# Patient Record
Sex: Female | Born: 1945
Health system: Southern US, Community
[De-identification: ages and names within clinical notes are randomized; demographics above are authoritative.]

## PROBLEM LIST (undated history)

## (undated) DIAGNOSIS — I10 Essential (primary) hypertension: Secondary | ICD-10-CM

## (undated) HISTORY — DX: Essential (primary) hypertension: I10

## (undated) HISTORY — PX: TUBAL LIGATION: SHX77

---

## 1999-09-16 ENCOUNTER — Encounter: Payer: Self-pay | Admitting: Obstetrics and Gynecology

## 1999-09-16 ENCOUNTER — Encounter: Admission: RE | Admit: 1999-09-16 | Discharge: 1999-09-16 | Payer: Self-pay | Admitting: Obstetrics and Gynecology

## 1999-10-07 ENCOUNTER — Other Ambulatory Visit: Admission: RE | Admit: 1999-10-07 | Discharge: 1999-10-07 | Payer: Self-pay | Admitting: Obstetrics and Gynecology

## 2000-11-30 ENCOUNTER — Encounter: Payer: Self-pay | Admitting: Obstetrics and Gynecology

## 2000-11-30 ENCOUNTER — Encounter: Admission: RE | Admit: 2000-11-30 | Discharge: 2000-11-30 | Payer: Self-pay | Admitting: Obstetrics and Gynecology

## 2001-01-19 ENCOUNTER — Other Ambulatory Visit: Admission: RE | Admit: 2001-01-19 | Discharge: 2001-01-19 | Payer: Self-pay | Admitting: Obstetrics and Gynecology

## 2001-12-13 ENCOUNTER — Encounter: Admission: RE | Admit: 2001-12-13 | Discharge: 2001-12-13 | Payer: Self-pay | Admitting: Obstetrics and Gynecology

## 2001-12-13 ENCOUNTER — Encounter: Payer: Self-pay | Admitting: Obstetrics and Gynecology

## 2003-09-19 ENCOUNTER — Encounter: Admission: RE | Admit: 2003-09-19 | Discharge: 2003-09-19 | Payer: Self-pay | Admitting: Obstetrics and Gynecology

## 2005-10-09 ENCOUNTER — Encounter: Admission: RE | Admit: 2005-10-09 | Discharge: 2005-10-09 | Payer: Self-pay | Admitting: Obstetrics and Gynecology

## 2006-03-26 ENCOUNTER — Encounter: Admission: RE | Admit: 2006-03-26 | Discharge: 2006-03-26 | Payer: Self-pay | Admitting: Obstetrics and Gynecology

## 2008-03-27 ENCOUNTER — Encounter: Admission: RE | Admit: 2008-03-27 | Discharge: 2008-03-27 | Payer: Self-pay | Admitting: Obstetrics and Gynecology

## 2009-08-01 ENCOUNTER — Encounter: Admission: RE | Admit: 2009-08-01 | Discharge: 2009-08-01 | Payer: Self-pay | Admitting: Obstetrics and Gynecology

## 2011-06-15 ENCOUNTER — Other Ambulatory Visit: Payer: Self-pay | Admitting: Gastroenterology

## 2011-06-15 DIAGNOSIS — R1011 Right upper quadrant pain: Secondary | ICD-10-CM

## 2011-06-22 ENCOUNTER — Ambulatory Visit
Admission: RE | Admit: 2011-06-22 | Discharge: 2011-06-22 | Disposition: A | Discharge status: Home / Self Care | Payer: Medicare Other | Source: Ambulatory Visit | Attending: Gastroenterology | Admitting: Gastroenterology

## 2011-06-22 DIAGNOSIS — R1011 Right upper quadrant pain: Secondary | ICD-10-CM

## 2011-06-25 ENCOUNTER — Other Ambulatory Visit (HOSPITAL_COMMUNITY): Payer: Self-pay | Admitting: Gastroenterology

## 2011-07-03 ENCOUNTER — Other Ambulatory Visit (HOSPITAL_COMMUNITY): Payer: Medicare Other

## 2011-07-14 HISTORY — PX: CHOLECYSTECTOMY: SHX55

## 2011-07-23 ENCOUNTER — Encounter (HOSPITAL_COMMUNITY)
Admission: RE | Admit: 2011-07-23 | Discharge: 2011-07-23 | Disposition: A | Discharge status: Home / Self Care | Payer: Medicare Other | Source: Ambulatory Visit | Attending: Gastroenterology | Admitting: Gastroenterology

## 2011-07-23 DIAGNOSIS — R197 Diarrhea, unspecified: Secondary | ICD-10-CM | POA: Diagnosis not present

## 2011-07-23 DIAGNOSIS — R109 Unspecified abdominal pain: Secondary | ICD-10-CM | POA: Diagnosis not present

## 2011-07-23 MED ORDER — TECHNETIUM TC 99M MEBROFENIN IV KIT
5.3000 | PACK | Freq: Once | INTRAVENOUS | Status: AC | PRN
  Administered 2011-07-23: 5 via INTRAVENOUS

## 2011-07-30 DIAGNOSIS — R1011 Right upper quadrant pain: Secondary | ICD-10-CM | POA: Diagnosis not present

## 2011-08-11 ENCOUNTER — Other Ambulatory Visit: Payer: Self-pay | Admitting: Obstetrics and Gynecology

## 2011-08-11 DIAGNOSIS — Z1231 Encounter for screening mammogram for malignant neoplasm of breast: Secondary | ICD-10-CM

## 2011-09-02 ENCOUNTER — Encounter (INDEPENDENT_AMBULATORY_CARE_PROVIDER_SITE_OTHER): Payer: Self-pay | Admitting: General Surgery

## 2011-09-02 ENCOUNTER — Ambulatory Visit (INDEPENDENT_AMBULATORY_CARE_PROVIDER_SITE_OTHER): Payer: Medicare Other | Admitting: General Surgery

## 2011-09-02 VITALS — BP 152/98 | HR 98 | Temp 98.5°F | Resp 16 | Ht 63.0 in | Wt 134.0 lb

## 2011-09-02 DIAGNOSIS — R1011 Right upper quadrant pain: Secondary | ICD-10-CM

## 2011-09-02 NOTE — Progress Notes (Signed)
Subjective:   Right upper cautery and discomfort and diarrhea  Patient ID: Brianna Snyder, female   DOB: [birth year], 66 y.o.   MRN: [id number]  HPI Patient is a very pleasant 66 year old female referred through the courtesy of Dr. Vilinda Boehringer. I had operated on her husband a number of years ago. She has been followed by Dr. Randa Evens for some time due to abdominal and GI complaints. She states she initially had onset of her symptoms about 6 years ago but they were mild and intermittent. It has now become essentially a daily problem for her. Her main complaint is actually diarrhea. This occurs after eating. She also has right upper quadrant and right mid abdominal discomfort after eating. She says this has not really severe pain but more discomfort and a feeling of something not right". This always occurs after eating and usually with fatty or suite meals and can be avoided with bland low-fat food. She takes Imodium and Pepto-Bismol frequently. Her symptoms will last for about an hour after eating and then gradually fade. She has not had any fever. No radiation of the pain. She has some mild nausea but no vomiting in association.  Workup to date by Dr. Randa Evens includes an abdominal ultrasound with a normal gallbladder and some fatty infiltration of the liver. She has had 2 previous colonoscopies with benign polyps removed but otherwise negative exams. She has had a negative celiac test. HIDA scan was then performed which showed a markedly low ejection fraction of 7% with slight exacerbation of her symptoms with CCK. Past Medical History  Diagnosis Date  . Hypertension    Past Surgical History  Procedure Date  . Tubal ligation    Current Outpatient Prescriptions  Medication Sig Dispense Refill  . triamterene-hydrochlorothiazide (MAXZIDE-25) 37.5-25 MG per tablet Take 1 tablet by mouth daily.       No Known Allergies History  Substance Use Topics  . Smoking status: Never Smoker   . Smokeless  tobacco: Not on file  . Alcohol Use: No    Review of Systems  Constitutional: Negative.   Respiratory: Negative.   Cardiovascular: Negative.   Gastrointestinal: Positive for nausea, abdominal pain and diarrhea. Negative for vomiting, constipation, blood in stool, abdominal distention, anal bleeding and rectal pain.  Musculoskeletal: Negative.   Hematological: Negative.        Objective:   Physical Exam BP 152/98  Pulse 98  Temp(Src) 98.5 F (36.9 C) (Temporal)  Resp 16  Ht 5\' 3"  (1.6 m)  Wt 134 lb (60.782 kg)  BMI 23.74 kg/m2  General: Alert, well-developed Caucasian female, in no distress Skin: Warm and dry without rash or infection. HEENT: No palpable masses or thyromegaly. Sclera nonicteric. Pupils equal round and reactive. Oropharynx clear. Lymph nodes: No cervical, supraclavicular, or inguinal nodes palpable. Lungs: Breath sounds clear and equal without increased work of breathing Cardiovascular: Regular rate and rhythm without murmur. No JVD or edema. Peripheral pulses intact. Abdomen: Nondistended. Soft with mild tenderness in the right mid abdomen and right upper quadrant.. No masses palpable. No organomegaly. No palpable hernias. Extremities: No edema or joint swelling or deformity. No chronic venous stasis changes. Neurologic: Alert and fully oriented. Gait normal.     Assessment:     Persistent and worsening abdominal symptoms of right mid abdominal discomfort and diarrhea post-fatty foods. She has had a thorough workup that is negative except for an abnormal HIDA scan with markedly reduced ejection fraction. I discussed with her that her symptoms are not  classic for gallbladder disease in that diarrhea is her prominent complaint. However she does have some localized discomfort and tenderness. There is a family history of gallbladder disease. I told her that I thought there was about a 50/50 chance of significantly improving her symptoms with cholecystectomy. We  discussed risks of bleeding, infection, bile leak, bile duct injury, possible need for open surgery and anesthetic risks. We discussed that some patients have worsening of diarrhea after gallbladder surgery. After extensive discussion and her careful consideration she feels that her symptoms are worsening and ongoing for long enough that she would like to go ahead with surgery in an attempt to get some relief. I think this is reasonable as she understands the issues very well.    Plan:      Laparoscopic cholecystectomy with intraoperative cholangiogram under general anesthesia as an outpatient

## 2011-09-09 ENCOUNTER — Ambulatory Visit
Admission: RE | Admit: 2011-09-09 | Discharge: 2011-09-09 | Disposition: A | Discharge status: Home / Self Care | Payer: Medicare Other | Source: Ambulatory Visit | Attending: Obstetrics and Gynecology | Admitting: Obstetrics and Gynecology

## 2011-09-09 DIAGNOSIS — Z1231 Encounter for screening mammogram for malignant neoplasm of breast: Secondary | ICD-10-CM

## 2011-09-21 ENCOUNTER — Telehealth (INDEPENDENT_AMBULATORY_CARE_PROVIDER_SITE_OTHER): Payer: Self-pay | Admitting: General Surgery

## 2011-09-21 DIAGNOSIS — R1011 Right upper quadrant pain: Secondary | ICD-10-CM | POA: Diagnosis not present

## 2011-09-23 DIAGNOSIS — K811 Chronic cholecystitis: Secondary | ICD-10-CM | POA: Diagnosis not present

## 2011-09-23 DIAGNOSIS — K824 Cholesterolosis of gallbladder: Secondary | ICD-10-CM

## 2011-10-15 ENCOUNTER — Encounter (INDEPENDENT_AMBULATORY_CARE_PROVIDER_SITE_OTHER): Payer: Self-pay | Admitting: General Surgery

## 2011-10-15 ENCOUNTER — Ambulatory Visit (INDEPENDENT_AMBULATORY_CARE_PROVIDER_SITE_OTHER): Payer: Medicare Other | Admitting: General Surgery

## 2011-10-15 ENCOUNTER — Telehealth (INDEPENDENT_AMBULATORY_CARE_PROVIDER_SITE_OTHER): Payer: Self-pay

## 2011-10-15 VITALS — BP 126/78 | HR 70 | Temp 97.4°F | Resp 16 | Ht 63.0 in | Wt 130.0 lb

## 2011-10-15 DIAGNOSIS — Z09 Encounter for follow-up examination after completed treatment for conditions other than malignant neoplasm: Secondary | ICD-10-CM

## 2011-10-15 NOTE — Progress Notes (Addendum)
History: Patient returns 3 weeks following laparoscopic cholecystectomy for biliary dyskinesia/chronic cholecystitis. Happily, she states she is "thrilled" with the results of the surgery. Her preoperative discomfort and bloating and diarrhea to this point have been completely relieved. She has been able to eat foods that she would not try previously.  Exam: She appears well. Abdomen is soft and nontender. Wounds are well healed.  Assessment and plan: Doing well with excellent relief of symptoms to this point and no evidence of complication. The pathology report is not available on the computer we will get this and forward the information to her. She will return as needed.  Her path report is now available and indicated chronic cholecystitis and cholesterolosis

## 2011-10-15 NOTE — Telephone Encounter (Signed)
Per patient request pathology results faxed to personal fax # [phone].  Results were not available at patient office visit on 10/15/11 w/Dr. Johna Sheriff.

## 2011-10-16 ENCOUNTER — Encounter (INDEPENDENT_AMBULATORY_CARE_PROVIDER_SITE_OTHER): Payer: Self-pay | Admitting: General Surgery

## 2011-10-27 DIAGNOSIS — H02839 Dermatochalasis of unspecified eye, unspecified eyelid: Secondary | ICD-10-CM | POA: Diagnosis not present

## 2011-11-23 DIAGNOSIS — M6530 Trigger finger, unspecified finger: Secondary | ICD-10-CM | POA: Diagnosis not present

## 2011-11-23 DIAGNOSIS — M25549 Pain in joints of unspecified hand: Secondary | ICD-10-CM | POA: Diagnosis not present

## 2011-12-01 DIAGNOSIS — W57XXXA Bitten or stung by nonvenomous insect and other nonvenomous arthropods, initial encounter: Secondary | ICD-10-CM | POA: Diagnosis not present

## 2011-12-01 DIAGNOSIS — T148 Other injury of unspecified body region: Secondary | ICD-10-CM | POA: Diagnosis not present

## 2011-12-11 DIAGNOSIS — H02839 Dermatochalasis of unspecified eye, unspecified eyelid: Secondary | ICD-10-CM | POA: Diagnosis not present

## 2011-12-11 DIAGNOSIS — H5340 Unspecified visual field defects: Secondary | ICD-10-CM | POA: Diagnosis not present

## 2011-12-11 DIAGNOSIS — H0230 Blepharochalasis unspecified eye, unspecified eyelid: Secondary | ICD-10-CM | POA: Diagnosis not present

## 2012-02-14 ENCOUNTER — Emergency Department (HOSPITAL_COMMUNITY): Payer: Medicare Other

## 2012-02-14 ENCOUNTER — Inpatient Hospital Stay (HOSPITAL_COMMUNITY)
Admission: EM | Admit: 2012-02-14 | Discharge: 2012-02-17 | DRG: 064 | Disposition: A | Discharge status: Home / Self Care | Payer: Medicare Other | Attending: Internal Medicine | Admitting: Internal Medicine

## 2012-02-14 ENCOUNTER — Encounter (HOSPITAL_COMMUNITY): Payer: Self-pay | Admitting: *Deleted

## 2012-02-14 DIAGNOSIS — F29 Unspecified psychosis not due to a substance or known physiological condition: Secondary | ICD-10-CM | POA: Diagnosis not present

## 2012-02-14 DIAGNOSIS — I639 Cerebral infarction, unspecified: Secondary | ICD-10-CM | POA: Diagnosis present

## 2012-02-14 DIAGNOSIS — Z23 Encounter for immunization: Secondary | ICD-10-CM

## 2012-02-14 DIAGNOSIS — R51 Headache: Secondary | ICD-10-CM | POA: Diagnosis present

## 2012-02-14 DIAGNOSIS — I6789 Other cerebrovascular disease: Secondary | ICD-10-CM | POA: Diagnosis not present

## 2012-02-14 DIAGNOSIS — I10 Essential (primary) hypertension: Secondary | ICD-10-CM | POA: Diagnosis present

## 2012-02-14 DIAGNOSIS — I69920 Aphasia following unspecified cerebrovascular disease: Secondary | ICD-10-CM | POA: Diagnosis not present

## 2012-02-14 DIAGNOSIS — I69320 Aphasia following cerebral infarction: Secondary | ICD-10-CM

## 2012-02-14 DIAGNOSIS — D72829 Elevated white blood cell count, unspecified: Secondary | ICD-10-CM | POA: Diagnosis present

## 2012-02-14 DIAGNOSIS — I6350 Cerebral infarction due to unspecified occlusion or stenosis of unspecified cerebral artery: Secondary | ICD-10-CM | POA: Diagnosis not present

## 2012-02-14 DIAGNOSIS — K089 Disorder of teeth and supporting structures, unspecified: Secondary | ICD-10-CM | POA: Diagnosis not present

## 2012-02-14 DIAGNOSIS — R4182 Altered mental status, unspecified: Secondary | ICD-10-CM | POA: Diagnosis not present

## 2012-02-14 DIAGNOSIS — E785 Hyperlipidemia, unspecified: Secondary | ICD-10-CM | POA: Diagnosis present

## 2012-02-14 DIAGNOSIS — G9340 Encephalopathy, unspecified: Secondary | ICD-10-CM | POA: Diagnosis present

## 2012-02-14 DIAGNOSIS — E876 Hypokalemia: Secondary | ICD-10-CM | POA: Diagnosis present

## 2012-02-14 DIAGNOSIS — R4701 Aphasia: Secondary | ICD-10-CM | POA: Diagnosis present

## 2012-02-14 DIAGNOSIS — R410 Disorientation, unspecified: Secondary | ICD-10-CM

## 2012-02-14 LAB — CK TOTAL AND CKMB (NOT AT ARMC)
CK, MB: 1.8 ng/mL (ref 0.3–4.0)
Relative Index: INVALID (ref 0.0–2.5)
Total CK: 94 U/L (ref 7–177)

## 2012-02-14 LAB — CBC
Hemoglobin: 14.3 g/dL (ref 12.0–15.0)
MCH: 29.7 pg (ref 26.0–34.0)
MCHC: 35.2 g/dL (ref 30.0–36.0)
MCV: 84.4 fL (ref 78.0–100.0)
Platelets: 329 10*3/uL (ref 150–400)
RBC: 4.81 MIL/uL (ref 3.87–5.11)

## 2012-02-14 LAB — URINALYSIS, ROUTINE W REFLEX MICROSCOPIC
Glucose, UA: NEGATIVE mg/dL
Ketones, ur: NEGATIVE mg/dL
Leukocytes, UA: NEGATIVE
Nitrite: NEGATIVE
Protein, ur: NEGATIVE mg/dL
Urobilinogen, UA: 0.2 mg/dL (ref 0.0–1.0)

## 2012-02-14 LAB — COMPREHENSIVE METABOLIC PANEL
Albumin: 3.6 g/dL (ref 3.5–5.2)
BUN: 7 mg/dL (ref 6–23)
Calcium: 9.5 mg/dL (ref 8.4–10.5)
Creatinine, Ser: 0.58 mg/dL (ref 0.50–1.10)
GFR calc Af Amer: >90 mL/min (ref >90)
Glucose, Bld: 100 mg/dL — ABNORMAL HIGH (ref 70–99)
Total Protein: 7.1 g/dL (ref 6.0–8.3)

## 2012-02-14 LAB — DIFFERENTIAL
Basophils Relative: 0 % (ref 0–1)
Eosinophils Absolute: 0.1 10*3/uL (ref 0.0–0.7)
Eosinophils Relative: 1 % (ref 0–5)
Lymphs Abs: 3.2 10*3/uL (ref 0.7–4.0)
Monocytes Relative: 9 % (ref 3–12)
Neutrophils Relative %: 63 % (ref 43–77)

## 2012-02-14 LAB — PROTIME-INR
INR: 1 (ref 0.00–1.49)
Prothrombin Time: 13.4 seconds (ref 11.6–15.2)

## 2012-02-14 LAB — POCT I-STAT TROPONIN I

## 2012-02-14 IMAGING — CT CT HEAD W/O CM
1 of 2 series · 13 of 30 positions shown, 17 images · non-contrast
Comparison: None.

CLINICAL DATA: Hypertension, altered mental status

CT HEAD WITHOUT CONTRAST
TECHNIQUE: Contiguous axial images were obtained from the base of
the skull through the vertex without contrast.

[Series 2: brain · axial · 0.47mm/px · z∈[+116,+247]mm · 13 of 32 slices shown, 17 images]
[im 3/32  brain]
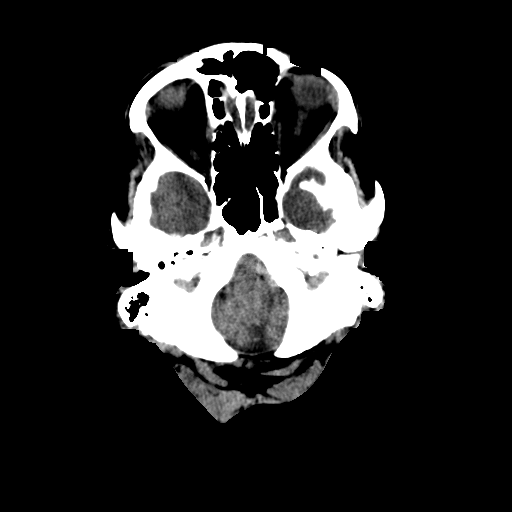
[im 3/32  bone]
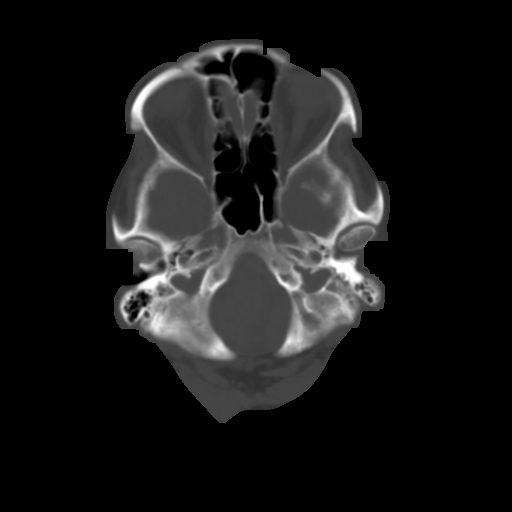
[im 5/32  brain]
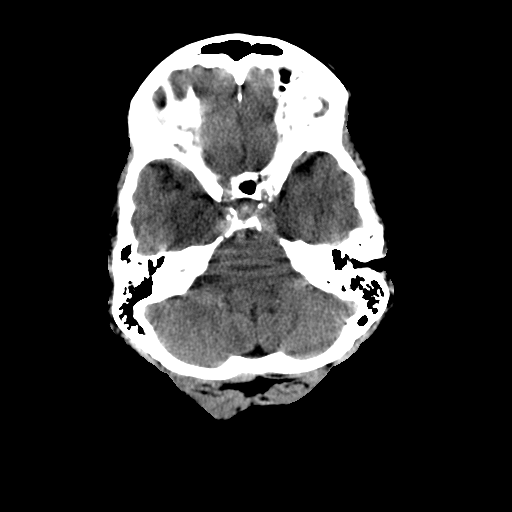
[im 7/32  brain]
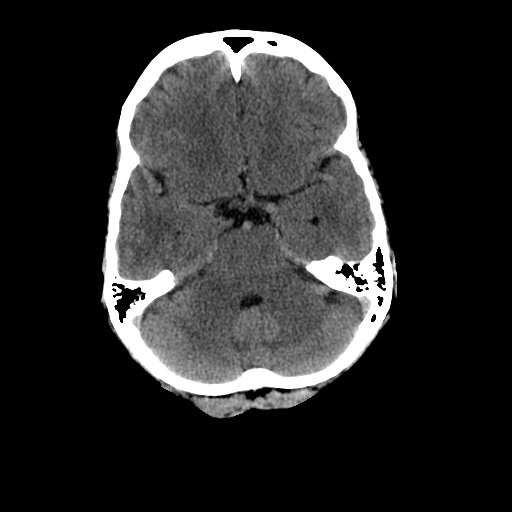
[im 9/32  brain]
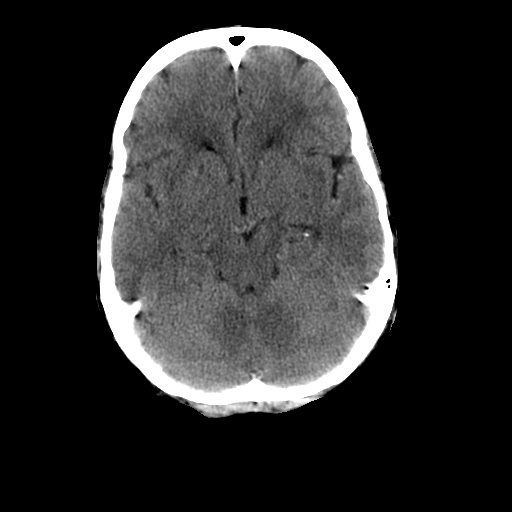
[im 12/32  brain]
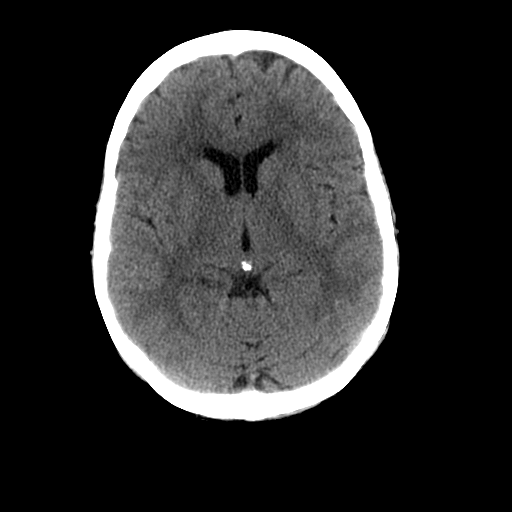
[im 12/32  bone]
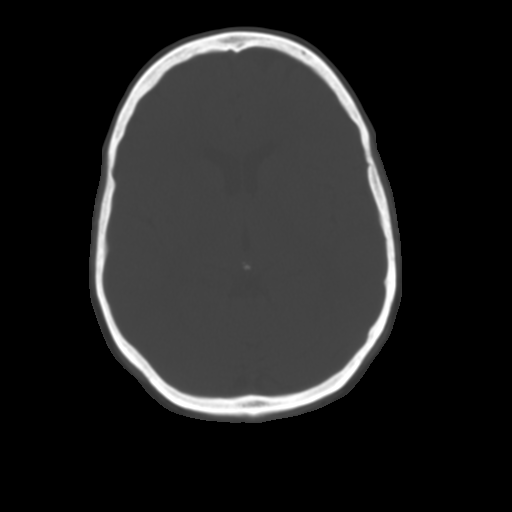
[im 14/32  brain]
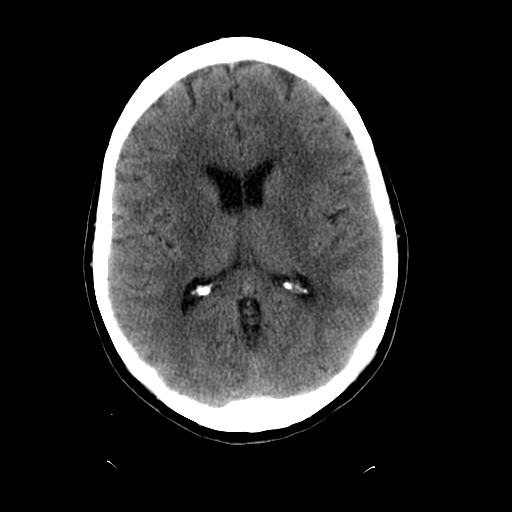
[im 16/32  brain]
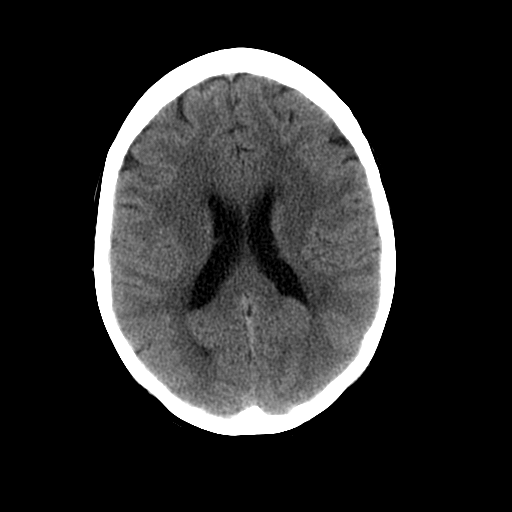
[im 18/32  brain]
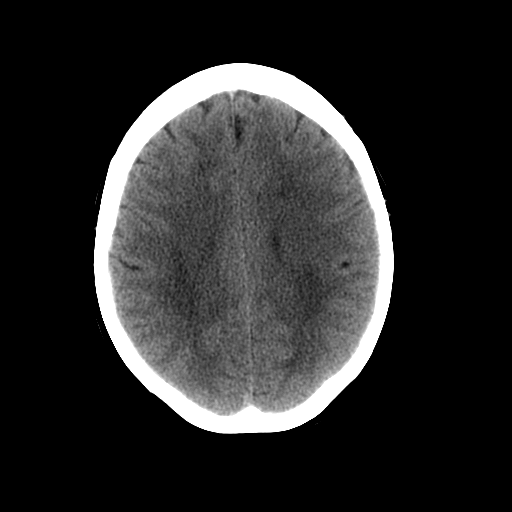
[im 20/32  brain]
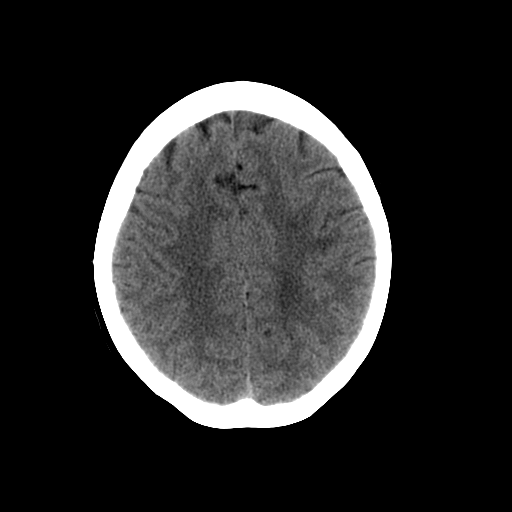
[im 20/32  bone]
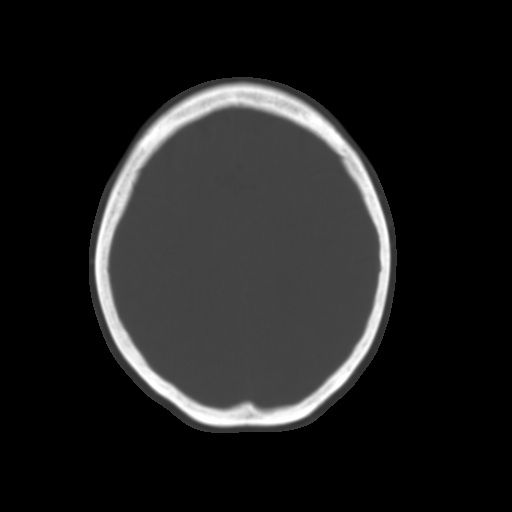
[im 23/32  brain]
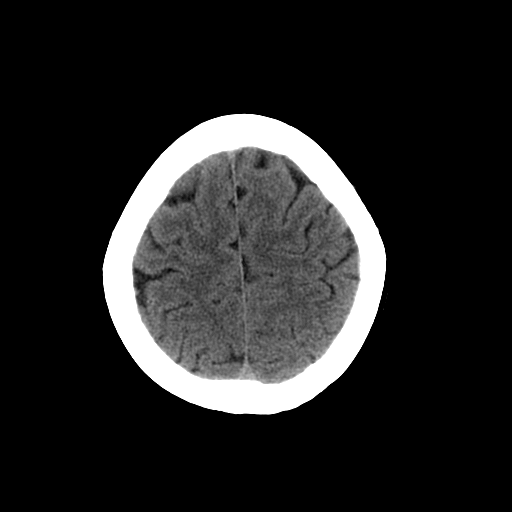
[im 25/32  brain]
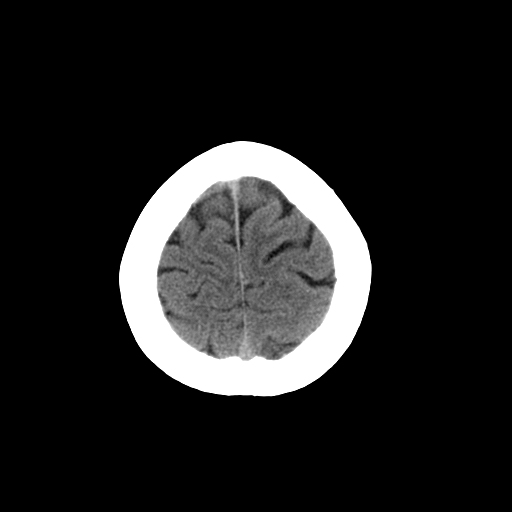
[im 27/32  brain]
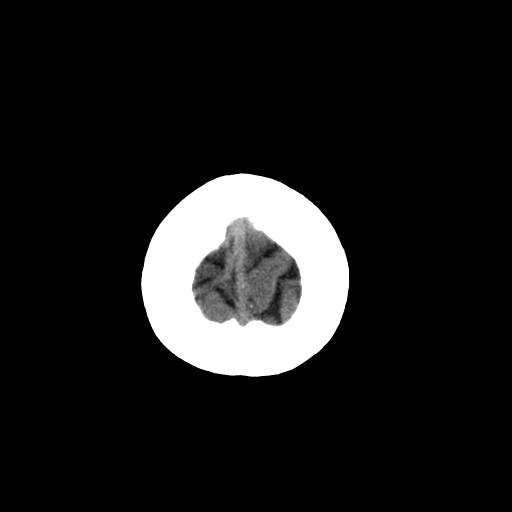
[im 29/32  brain]
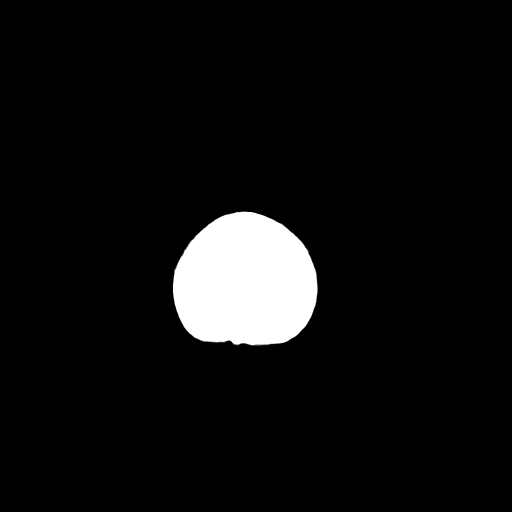
[im 29/32  bone]
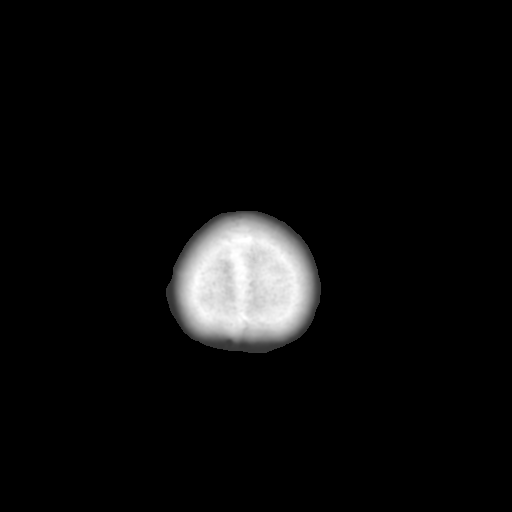

[13 of 30 positions shown; findings below may reference images not displayed]

FINDINGS: Patchy periventricular and subcortical white matter
hypoattenuation compatible with chronic microvascular ischemic
changes.  No acute intracranial hemorrhage, definite acute
infarction, mass lesion, midline shift, herniation, hydrocephalus,
or extra-axial fluid collection.  Cisterns patent.  No cerebellar
abnormality.  Mastoids and sinuses remain clear.  Very minimal
right ethmoid mucosal thickening, image #1.
IMPRESSION: Chronic microvascular ischemic changes in the deep white matter.

No acute finding by noncontrast CT

## 2012-02-14 MED ORDER — MORPHINE SULFATE 2 MG/ML IJ SOLN
2.0000 mg | Freq: Once | INTRAMUSCULAR | Status: AC
  Administered 2012-02-14: 2 mg via INTRAVENOUS
  Filled 2012-02-14: qty 1

## 2012-02-14 MED ORDER — NICARDIPINE HCL IN NACL 20-0.86 MG/200ML-% IV SOLN
5.0000 mg/h | Freq: Once | INTRAVENOUS | Status: DC
  Filled 2012-02-14: qty 200

## 2012-02-14 MED ORDER — LABETALOL HCL 5 MG/ML IV SOLN
10.0000 mg | Freq: Once | INTRAVENOUS | Status: AC
  Administered 2012-02-14: 10 mg via INTRAVENOUS
  Filled 2012-02-14: qty 4

## 2012-02-14 NOTE — ED Notes (Signed)
Pt finished shower at 10 am and noticed that she was forgetting things like the name for [place] or the word for shower.  She went to church and exp a headache.  She took her bp at home and it was 177/93 (pt is not on bp meds).  No facial droop.  Grips strong and equal. Pt was on a cruise 2 weeks prior and took a motion sickness patch that made her disoriented.  Pt is on cephilexin for pre-tx of dental implants.

## 2012-02-14 NOTE — ED Provider Notes (Signed)
History     CSN: [id number]  Arrival date & time 02/14/12  1952   First MD Initiated Contact with Patient 02/14/12 2014      Chief Complaint  Patient presents with  . Altered Mental Status    since 10 am    (Consider location/radiation/quality/duration/timing/severity/associated sxs/prior treatment) HPI Pt has difficulty word finding and has been disoriented to date since this morning at 10 AM. She has no visual changes, focal weakness, sensory loss, fever, chills, N/V/D, abd pain, urinary sx, CP. BP noted to be elevated. Pt recently started taking Keflex for dental surgery scheduled this week.  Past Medical History  Diagnosis Date  . Hypertension     Past Surgical History  Procedure Date  . Tubal ligation   . Cholecystectomy 2013    Family History  Problem Relation Age of Onset  . Heart disease Mother   . Stroke Father     History  Substance Use Topics  . Smoking status: Never Smoker   . Smokeless tobacco: Not on file  . Alcohol Use: No    OB History    Grav Para Term Preterm Abortions TAB SAB Ect Mult Living                  Review of Systems  Constitutional: Negative for fever and chills.  HENT: Negative for sore throat, neck pain, neck stiffness and sinus pressure.   Eyes: Negative for pain and visual disturbance.  Respiratory: Negative for shortness of breath and wheezing.   Cardiovascular: Negative for chest pain.  Gastrointestinal: Negative for nausea, vomiting, abdominal pain and diarrhea.  Genitourinary: Negative for dysuria.  Musculoskeletal: Negative for myalgias and back pain.  Skin: Negative for rash and wound.  Neurological: Positive for speech difficulty. Negative for dizziness, syncope, weakness, light-headedness, numbness and headaches.    Allergies  Review of patient's allergies indicates no known allergies.  Home Medications   Current Outpatient Rx  Name Route Sig Dispense Refill  . CEPHALEXIN 500 MG PO CAPS Oral Take 500 mg by  mouth 4 (four) times daily.      BP 172/89  Pulse 92  Temp 99 F (37.2 C) (Oral)  Resp 16  SpO2 97%  Physical Exam  Nursing note and vitals reviewed. Constitutional: She is oriented to person, place, and time. She appears well-developed and well-nourished. No distress.  HENT:  Head: Normocephalic and atraumatic.  Mouth/Throat: Oropharynx is clear and moist.  Eyes: EOM are normal. Pupils are equal, round, and reactive to light.  Neck: Normal range of motion. Neck supple.       No meningismus   Cardiovascular: Normal rate and regular rhythm.   Pulmonary/Chest: Effort normal and breath sounds normal. No respiratory distress. She has no wheezes. She has no rales. She exhibits no tenderness.  Abdominal: Soft. Bowel sounds are normal. She exhibits no mass. There is no tenderness. There is no rebound and no guarding.  Musculoskeletal: Normal range of motion. She exhibits no edema and no tenderness.  Neurological: She is alert and oriented to person, place, and time.       5/5 motor in all ext, sensation intact, Cn II-XII intact, finger to nose intact. Pt is oriented but states the year is 13 and when asked specifically for the full year is unable to say 2013  Skin: Skin is warm and dry. No rash noted. No erythema.  Psychiatric: She has a normal mood and affect. Her behavior is normal.    ED Course  Procedures (  including critical care time)  Labs Reviewed  CBC - Abnormal; Notable for the following:    WBC 11.8 (*)     All other components within normal limits  DIFFERENTIAL - Abnormal; Notable for the following:    Monocytes Absolute 1.1 (*)     All other components within normal limits  COMPREHENSIVE METABOLIC PANEL - Abnormal; Notable for the following:    Potassium 3.4 (*)     Glucose, Bld 100 (*)     All other components within normal limits  PROTIME-INR  APTT  CK TOTAL AND CKMB  URINALYSIS, ROUTINE W REFLEX MICROSCOPIC  POCT I-STAT TROPONIN I  TSH  T4, FREE   Ct Head  Wo Contrast  02/14/2012  *RADIOLOGY REPORT*  Clinical Data: Hypertension, altered mental status  CT HEAD WITHOUT CONTRAST  Technique:  Contiguous axial images were obtained from the base of the skull through the vertex without contrast.  Comparison: None.  Findings: Patchy periventricular and subcortical white matter hypoattenuation compatible with chronic microvascular ischemic changes.  No acute intracranial hemorrhage, definite acute infarction, mass lesion, midline shift, herniation, hydrocephalus, or extra-axial fluid collection.  Cisterns patent.  No cerebellar abnormality.  Mastoids and sinuses remain clear.  Very minimal right ethmoid mucosal thickening, image #1.  IMPRESSION: Chronic microvascular ischemic changes in the deep white matter.  No acute finding by noncontrast CT  Original Report Authenticated By: Judie Petit. Ruel Favors, M.D.     1. Confusion   2. Hypertension       MDM   Discussed with neurology who suggest reducing BP no lower than 150/80. Will see in ED. [facility] to admit to stepdown      Loren Racer, MD 02/14/12 2310

## 2012-02-15 ENCOUNTER — Inpatient Hospital Stay (HOSPITAL_COMMUNITY): Payer: Medicare Other

## 2012-02-15 ENCOUNTER — Encounter (HOSPITAL_COMMUNITY): Payer: Self-pay | Admitting: Internal Medicine

## 2012-02-15 DIAGNOSIS — G9340 Encephalopathy, unspecified: Secondary | ICD-10-CM | POA: Diagnosis not present

## 2012-02-15 DIAGNOSIS — K089 Disorder of teeth and supporting structures, unspecified: Secondary | ICD-10-CM | POA: Diagnosis not present

## 2012-02-15 DIAGNOSIS — F29 Unspecified psychosis not due to a substance or known physiological condition: Secondary | ICD-10-CM

## 2012-02-15 DIAGNOSIS — I10 Essential (primary) hypertension: Secondary | ICD-10-CM | POA: Diagnosis present

## 2012-02-15 DIAGNOSIS — I6350 Cerebral infarction due to unspecified occlusion or stenosis of unspecified cerebral artery: Secondary | ICD-10-CM | POA: Diagnosis not present

## 2012-02-15 LAB — LIPID PANEL
Cholesterol: 209 mg/dL — ABNORMAL HIGH (ref 0–200)
VLDL: 58 mg/dL — ABNORMAL HIGH (ref 0–40)

## 2012-02-15 LAB — CARDIAC PANEL(CRET KIN+CKTOT+MB+TROPI)
CK, MB: 1.2 ng/mL (ref 0.3–4.0)
Relative Index: INVALID (ref 0.0–2.5)
Total CK: 67 U/L (ref 7–177)
Troponin I: <0.3 ng/mL (ref <0.30)

## 2012-02-15 LAB — COMPREHENSIVE METABOLIC PANEL
AST: 20 U/L (ref 0–37)
Albumin: 3.6 g/dL (ref 3.5–5.2)
Calcium: 9.2 mg/dL (ref 8.4–10.5)
Creatinine, Ser: 0.52 mg/dL (ref 0.50–1.10)
Sodium: 139 mEq/L (ref 135–145)
Total Protein: 6.9 g/dL (ref 6.0–8.3)

## 2012-02-15 LAB — HEMOGLOBIN A1C
Hgb A1c MFr Bld: 5.9 % — ABNORMAL HIGH (ref <5.7)
Mean Plasma Glucose: 123 mg/dL — ABNORMAL HIGH (ref <117)

## 2012-02-15 LAB — T4, FREE: Free T4: 0.96 ng/dL (ref 0.80–1.80)

## 2012-02-15 LAB — GLUCOSE, CAPILLARY
Glucose-Capillary: 118 mg/dL — ABNORMAL HIGH (ref 70–99)
Glucose-Capillary: 125 mg/dL — ABNORMAL HIGH (ref 70–99)

## 2012-02-15 LAB — TSH: TSH: 0.755 u[IU]/mL (ref 0.350–4.500)

## 2012-02-15 LAB — T3, FREE: T3, Free: 2.3 pg/mL (ref 2.3–4.2)

## 2012-02-15 MED ORDER — ONDANSETRON HCL 4 MG/2ML IJ SOLN
4.0000 mg | Freq: Four times a day (QID) | INTRAMUSCULAR | Status: DC
  Administered 2012-02-15 (×2): 4 mg via INTRAVENOUS
  Filled 2012-02-15 (×2): qty 2

## 2012-02-15 MED ORDER — LABETALOL HCL 5 MG/ML IV SOLN: 10.0000 mg | INTRAVENOUS | Status: DC | PRN

## 2012-02-15 MED ORDER — ATORVASTATIN CALCIUM 10 MG PO TABS
10.0000 mg | ORAL_TABLET | Freq: Every day | ORAL | Status: DC
  Administered 2012-02-15 – 2012-02-16 (×2): 10 mg via ORAL
  Filled 2012-02-15 (×3): qty 1

## 2012-02-15 MED ORDER — ACETAMINOPHEN 325 MG PO TABS
650.0000 mg | ORAL_TABLET | ORAL | Status: DC | PRN
  Administered 2012-02-15 (×3): 650 mg via ORAL
  Filled 2012-02-15 (×3): qty 2

## 2012-02-15 MED ORDER — OXYCODONE HCL 5 MG PO TABS: 5.0000 mg | ORAL_TABLET | ORAL | Status: DC | PRN

## 2012-02-15 MED ORDER — POTASSIUM CHLORIDE CRYS ER 20 MEQ PO TBCR
20.0000 meq | EXTENDED_RELEASE_TABLET | Freq: Two times a day (BID) | ORAL | Status: AC
  Administered 2012-02-15 – 2012-02-17 (×4): 20 meq via ORAL
  Filled 2012-02-15 (×4): qty 1

## 2012-02-15 MED ORDER — INSULIN ASPART 100 UNIT/ML ~~LOC~~ SOLN
0.0000 [IU] | Freq: Three times a day (TID) | SUBCUTANEOUS | Status: DC
  Administered 2012-02-16 (×2): 1 [IU] via SUBCUTANEOUS

## 2012-02-15 MED ORDER — MORPHINE SULFATE 2 MG/ML IJ SOLN: 1.0000 mg | INTRAMUSCULAR | Status: DC | PRN

## 2012-02-15 MED ORDER — LISINOPRIL 5 MG PO TABS
5.0000 mg | ORAL_TABLET | Freq: Every day | ORAL | Status: DC
  Administered 2012-02-15 – 2012-02-17 (×3): 5 mg via ORAL
  Filled 2012-02-15 (×3): qty 1

## 2012-02-15 MED ORDER — ONDANSETRON HCL 4 MG/2ML IJ SOLN: 4.0000 mg | INTRAMUSCULAR | Status: DC | PRN

## 2012-02-15 NOTE — Progress Notes (Signed)
Pt down to radiology for MRI/MRA and panarex.  Tol procedures without event and back to room at 0958. No complaints.

## 2012-02-15 NOTE — H&P (Addendum)
Brianna Snyder is an 66 y.o. female. Patient was seen and examined on February 15, 2012 at 1:19 AM. PCP - Dr. Lupe Carney.    Chief Complaint: Confusion.  HPI: 66 year old female with history of hypertension was brought to the ER by patient's husband as patient was getting increasingly confused. Patient has per her husband had woken up and had breakfast. At around 10 AM patient started becoming increasingly confused with difficulty finding words to talk. Patient also was complaining of some left sided facial pain around the upper molars and left maxillary area. Patient did not lose consciousness or did not have any focal deficits. Did not complain of any blurred vision or nausea vomiting abdominal pain diarrhea neck pain chest pain or shortness of breath or dysuria. Patient is scheduled to have crowning of her left upper molar by this week. She had gone to her dentist last week and as per her husband patient was placed empirically on Keflex in preparation for the crowning. She had the dental implants placed 3 months ago. Denies having taken any pain medications. Patient did have a history of hypertension and has not been taking antihypertensives since last December due to the medications for blood pressure causing some gastric upset. In the ER patient was found to be confused. CT head did not show any acute. Neurologist was consulted and at this time neurologist feels her encephalopathy is related to uncontrolled hypertension. Patient will be admitted for further management. Patient when I examined was mildly drowsy and she did receive some pain relief medications. 2 weeks ago they had returned from a trip to [place]. During that trip they did not have any episodes of fever or diarrhea. They did wear patches for motion sickness which was removed 2 weeks ago.  Past Medical History  Diagnosis Date  . Hypertension     Past Surgical History  Procedure Date  . Tubal ligation   . Cholecystectomy 2013     Family History  Problem Relation Age of Onset  . Heart disease Mother   . Stroke Father    Social History:  reports that she has never smoked. She does not have any smokeless tobacco history on file. She reports that she does not drink alcohol or use illicit drugs.  Allergies: No Known Allergies   (Not in a hospital admission)  Results for orders placed during the hospital encounter of 02/14/12 (from the past 48 hour(s))  URINALYSIS, ROUTINE W REFLEX MICROSCOPIC     Status: Normal   Collection Time   02/14/12  9:05 PM      Component Value Range Comment   Color, Urine YELLOW  YELLOW    APPearance CLEAR  CLEAR    Specific Gravity, Urine 1.006  1.005 - 1.030    pH 6.0  5.0 - 8.0    Glucose, UA NEGATIVE  NEGATIVE mg/dL    Hgb urine dipstick NEGATIVE  NEGATIVE    Bilirubin Urine NEGATIVE  NEGATIVE    Ketones, ur NEGATIVE  NEGATIVE mg/dL    Protein, ur NEGATIVE  NEGATIVE mg/dL    Urobilinogen, UA 0.2  0.0 - 1.0 mg/dL    Nitrite NEGATIVE  NEGATIVE    Leukocytes, UA NEGATIVE  NEGATIVE MICROSCOPIC NOT DONE ON URINES WITH NEGATIVE PROTEIN, BLOOD, LEUKOCYTES, NITRITE, OR GLUCOSE <1000 mg/dL.  CK TOTAL AND CKMB     Status: Normal   Collection Time   02/14/12  9:49 PM      Component Value Range Comment   Total CK  94  7 - 177 U/L    CK, MB 1.8  0.3 - 4.0 ng/mL    Relative Index RELATIVE INDEX IS INVALID  0.0 - 2.5   PROTIME-INR     Status: Normal   Collection Time   02/14/12  9:50 PM      Component Value Range Comment   Prothrombin Time 13.4  11.6 - 15.2 seconds    INR 1.00  0.00 - 1.49   APTT     Status: Normal   Collection Time   02/14/12  9:50 PM      Component Value Range Comment   aPTT 32  24 - 37 seconds   CBC     Status: Abnormal   Collection Time   02/14/12  9:50 PM      Component Value Range Comment   WBC 11.8 (*) 4.0 - 10.5 K/uL    RBC 4.81  3.87 - 5.11 MIL/uL    Hemoglobin 14.3  12.0 - 15.0 g/dL    HCT 16.1  09.6 - 04.5 %    MCV 84.4  78.0 - 100.0 fL    MCH 29.7  26.0  - 34.0 pg    MCHC 35.2  30.0 - 36.0 g/dL    RDW 40.9  81.1 - 91.4 %    Platelets 329  150 - 400 K/uL   DIFFERENTIAL     Status: Abnormal   Collection Time   02/14/12  9:50 PM      Component Value Range Comment   Neutrophils Relative 63  43 - 77 %    Neutro Abs 7.5  1.7 - 7.7 K/uL    Lymphocytes Relative 27  12 - 46 %    Lymphs Abs 3.2  0.7 - 4.0 K/uL    Monocytes Relative 9  3 - 12 %    Monocytes Absolute 1.1 (*) 0.1 - 1.0 K/uL    Eosinophils Relative 1  0 - 5 %    Eosinophils Absolute 0.1  0.0 - 0.7 K/uL    Basophils Relative 0  0 - 1 %    Basophils Absolute 0.1  0.0 - 0.1 K/uL   COMPREHENSIVE METABOLIC PANEL     Status: Abnormal   Collection Time   02/14/12  9:50 PM      Component Value Range Comment   Sodium 139  135 - 145 mEq/L    Potassium 3.4 (*) 3.5 - 5.1 mEq/L    Chloride 100  96 - 112 mEq/L    CO2 28  19 - 32 mEq/L    Glucose, Bld 100 (*) 70 - 99 mg/dL    BUN 7  6 - 23 mg/dL    Creatinine, Ser 7.82  0.50 - 1.10 mg/dL    Calcium 9.5  8.4 - 95.6 mg/dL    Total Protein 7.1  6.0 - 8.3 g/dL    Albumin 3.6  3.5 - 5.2 g/dL    AST 23  0 - 37 U/L    ALT 22  0 - 35 U/L    Alkaline Phosphatase 84  39 - 117 U/L    Total Bilirubin 0.9  0.3 - 1.2 mg/dL    GFR calc non Af Amer >90  >90 mL/min    GFR calc Af Amer >90  >90 mL/min   POCT I-STAT TROPONIN I     Status: Normal   Collection Time   02/14/12  9:53 PM      Component Value Range Comment   Troponin  i, poc 0.00  0.00 - 0.08 ng/mL    Comment 3             Ct Head Wo Contrast  02/14/2012  *RADIOLOGY REPORT*  Clinical Data: Hypertension, altered mental status  CT HEAD WITHOUT CONTRAST  Technique:  Contiguous axial images were obtained from the base of the skull through the vertex without contrast.  Comparison: None.  Findings: Patchy periventricular and subcortical white matter hypoattenuation compatible with chronic microvascular ischemic changes.  No acute intracranial hemorrhage, definite acute infarction, mass lesion, midline  shift, herniation, hydrocephalus, or extra-axial fluid collection.  Cisterns patent.  No cerebellar abnormality.  Mastoids and sinuses remain clear.  Very minimal right ethmoid mucosal thickening, image #1.  IMPRESSION: Chronic microvascular ischemic changes in the deep white matter.  No acute finding by noncontrast CT  Original Report Authenticated By: Judie Petit. Ruel Favors, M.D.    Review of Systems  Constitutional: Negative.   Eyes: Negative.   Respiratory: Negative.   Cardiovascular: Negative.   Gastrointestinal: Negative.   Genitourinary: Negative.   Musculoskeletal: Negative.   Skin: Negative.   Neurological: Positive for headaches.       Confusion.  Psychiatric/Behavioral: Negative.     Blood pressure 160/71, pulse 88, temperature 99 F (37.2 C), temperature source Oral, resp. rate 16, SpO2 95.00%. Physical Exam  Constitutional: She is oriented to person, place, and time. She appears well-developed and well-nourished. No distress.  HENT:  Head: Normocephalic and atraumatic.  Right Ear: External ear normal.  Left Ear: External ear normal.  Nose: Nose normal.  Mouth/Throat: No oropharyngeal exudate.       Discolored gums of the left upper molars.  Eyes: Conjunctivae are normal. Pupils are equal, round, and reactive to light. Right eye exhibits no discharge. Left eye exhibits no discharge. No scleral icterus.  Neck: Normal range of motion. Neck supple.  Cardiovascular: Normal rate and regular rhythm.   Respiratory: Effort normal and breath sounds normal. No respiratory distress. She has no wheezes. She has no rales.  GI: Soft. Bowel sounds are normal. She exhibits no distension. There is no tenderness. There is no rebound.  Musculoskeletal: Normal range of motion. She exhibits no edema and no tenderness.  Neurological: She is oriented to person, place, and time.       Mildly drowsy. Follows commands but at times appears confused.Moves all extremities. No obvious facial asymmetry.    Skin: Skin is warm and dry. She is not diaphoretic.     Assessment/Plan #1. Acute encephalopathy - differentials include uncontrolled hypertension (see #2). Patient will be placed on neurochecks. Get MRI of the brain to rule out any stroke. Check ammonia levels,TSH and drug screen. Get chest x-ray to rule out infiltrates. Patient is afebrile at this time. If controlling blood pressure and MRI brain does not show anything acute and if the patient remains confused, then may need to get lumbar puncture. #2. Uncontrolled hypertension - patient is placed on when necessary IV labetalol for systolic blood pressure 180. I have started a small dose of lisinopril 5 mg daily which can be slowly titrated. #3. Left-sided facial pain - patient is indicating pain at the left upper molar area where she is to get her crowning done. I have ordered a orthopantogram to rule out any abscess formation. #4. Mild leukocytosis - follow CBC closely. Patient at this time is afebrile. UA does not show any features of UTI. Chest x-ray has been ordered. See #1.  CODE STATUS - full code.  Massa Pe N. 02/15/2012, 1:19 AM

## 2012-02-15 NOTE — Progress Notes (Addendum)
[facility] Progress Note [place] TEAM 1 - Stepdown/ICU TEAM   Brianna Snyder WUJ:[id number] DOB: [birth year] DOA: 02/14/2012 PCP: Benita Stabile, MD  Brief narrative: 66 year old female with history of hypertension was brought to the ER by patient's husband as patient was getting increasingly confused. Patient per her husband had woken up and had breakfast. At around 10 AM patient started becoming increasingly confused with difficulty finding words to talk. Patient also was complaining of some left sided facial pain around the upper molars and left maxillary area. Patient did not lose consciousness or did not have any focal deficits. Did not complain of any blurred vision or nausea vomiting abdominal pain diarrhea neck pain chest pain or shortness of breath or dysuria. Patient is scheduled to have crowning of her left upper molar by this week. She had gone to her dentist last week and as per her husband patient was placed empirically on Keflex in preparation for the crowning. She had the dental implants placed 3 months ago. Denies having taken any pain medications. Patient did have a history of hypertension and has not been taking antihypertensives since last December due to the medications for blood pressure causing some gastric upset. In the ER patient was found to be confused. CT head did not show any acute. Neurologist was consulted and at this time neurologist feels her encephalopathy is related to uncontrolled hypertension.   Assessment/Plan:  Acute encephalopathy/altered mental status - possible small left posterior insula CVA Neurology is following - will defer to Neuro as to the extent of CVA w/u that is indicated - I suspect this represents a HTN related lacunar event - I have spoken with the on-call neurologist who confirms that the stroke team has been informed and will followup with the patient in the morning - has passed a stroke swallow screen  Uncontrolled  hypertension Patient stopped taking her blood pressure medications in December 2012 - blood pressure is currently much better control - we will should avoid overaggressive correction given her long-term poor control  Left-sided facial pain  Hyperglycemia A1c does not meet criteria for a new diagnosis of diabetes - follow CBGs  Mild leukocytosis Likely due to stress de-margination - recheck in a.m.  Modest hypokalemia Replace  Code Status: Full Disposition Plan: Remain in step down unit  Consultants: Neurology  Antibiotics: None  DVT prophylaxis: SCDs  HPI/Subjective: Patient is seen for a followup visit   Objective: Blood pressure 162/83, pulse 95, temperature 98.1 F (36.7 C), temperature source Oral, resp. rate 21, height 5\' 4"  (1.626 m), weight 58.6 kg (129 lb 3 oz), SpO2 98.00%.  Intake/Output Summary (Last 24 hours) at 02/15/12 1426 Last data filed at 02/15/12 1153  Gross per 24 hour  Intake    220 ml  Output    175 ml  Net     45 ml     Exam: Followup exam is completed  Data Reviewed: Basic Metabolic Panel:  Lab 02/15/12 9562 02/14/12 2150  NA 139 139  K 3.0* 3.4*  CL 97 100  CO2 26 28  GLUCOSE 142* 100*  BUN 7 7  CREATININE 0.52 0.58  CALCIUM 9.2 9.5  MG -- --  PHOS -- --   Liver Function Tests:  Lab 02/15/12 0325 02/14/12 2150  AST 20 23  ALT 20 22  ALKPHOS 83 84  BILITOT 1.2 0.9  PROT 6.9 7.1  ALBUMIN 3.6 3.6    Lab 02/15/12 0308  AMMONIA 30   CBC:  Lab 02/14/12  2150  WBC 11.8*  NEUTROABS 7.5  HGB 14.3  HCT 40.6  MCV 84.4  PLT 329   Cardiac Enzymes:  Lab 02/15/12 0300 02/14/12 2149  CKTOTAL 67 94  CKMB 1.2 1.8  CKMBINDEX -- --  TROPONINI <0.30 --    Recent Results (from the past 240 hour(s))  MRSA PCR SCREENING     Status: Normal   Collection Time   02/15/12  3:15 AM      Component Value Range Status Comment   MRSA by PCR NEGATIVE  NEGATIVE Final      Studies:  Recent x-ray studies have been reviewed in  detail by the Attending Physician  Scheduled Meds:  Reviewed in detail by the Attending Physician   Lonia Blood, MD [facility] Office  [phone] Pager [phone]  On-Call/Text Page:      [email or web address]      password [email or web address]  If 7PM-7AM, please contact night-coverage [email or web address] Password [email or web address] 02/15/2012, 2:26 PM   LOS: 1 day

## 2012-02-15 NOTE — Progress Notes (Signed)
Asked by rounding neurologist to witness pt husband, Cerise Lieber, refusal of Lumbar puncture at this time.  Pt husband wants to discuss pt status with family physician, Dr. Clovis Riley.  Mr. Adee advised to let nursing staff know when he consents to lumbar puncture on wife.  Husband agreeable.

## 2012-02-15 NOTE — Progress Notes (Signed)
Pt vomited about 350cc's of green bile color secretions. Medicated with 4mg  of Zofran.  No complaints. Continue to monitor

## 2012-02-15 NOTE — Consult Note (Signed)
Consulting Physician: Dr. Loren Racer Location: ED Reason for consult: Word finding difficulty Charts, medications, labs and images reviewed      Chief Complaint:Word finding difficult HPI: Ms.  Brianna Snyder is an 66 y.o.  RHWF with  History of poorly controlled hypertension. She is comes with headaches and word finding difficulty since morning. But no other complaints. She stopped taking her antihypertensive medications, since December and her bp remains poorly controlled. Her headaches are frontal and that radiates to the occipital area, no nausea, no vomiting, no diarrhea, no diplopia, no neck pain, no motor or sensory deficits, no ataxia, no dizziness, no tinnitus, no visual filed cuts, no history of snoring,  No REM sleep disorder, No tick or mosquito bites. She has headaches on regular basis but today was worse.  She woke up with a headache But decided not to come to the hospital until 10 pm   She had checked her BP at home and  SBP was in 190 - 200 and DBP >100.  She has no speech impairment, no aphasia or dysarthria.  She is on ABX  prophylaxis for dental surgery next week.  She was on scopolamine for vertigo when she went on a cruise about 2 weeks ago.   Past Medical History  Diagnosis Date  . Hypertension     Past Surgical History  Procedure Date  . Tubal ligation   . Cholecystectomy 2013    Family History  Problem Relation Age of Onset  . Heart disease Mother   . Stroke Father     . White Matter disease  Social History:  reports that she has never smoked. She does not have any smokeless tobacco history on file. She reports that she does not drink alcohol or use illicit drugs.  Allergies: No Known Allergies  Medications Prior to Admission  Medication Sig Dispense Refill  . cephALEXin (KEFLEX) 500 MG capsule Take 500 mg by mouth 4 (four) times daily.        ROS: All the 14 review of systems were reviewed and pertinent are mentioned in the HPI  Physical  Examination: Blood pressure 160/71, pulse 100, temperature 100 F (37.8 C), temperature source Oral, resp. rate 16, height 5\' 4"  (1.626 m), weight 58.6 kg (129 lb 3 oz), SpO2 100.00%.  General Examination:    HEENT-  Normocephalic, no lesions, without obvious abnormality.  Normal external eye and conjunctiva.  Normal TM's bilaterally.  Normal auditory canals and external ears. Normal external nose, mucus membranes and septum.  Normal pharynx. No nuchal rigidity Neck supple with no masses, nodes, nodules or enlargement. Cardiovascular - RRR, no murmurs, rubs and or gallops Lungs -CTA Abdomen - Soft, non tender, non distended, bowel sounds present Extremities - No edema  Neurologic Examination: Mental Status: Alert, awake, follows commands, but appears to be confused and disoriented.  Speech fluent without evidence of aphasia.  Able to follow 3 step commands without difficulty. Cranial Nerves: II: visual fields grossly normal, pupils equal, round, reactive to light and accommodation III,IV, VI: ptosis not present, extra-ocular motions intact bilaterally V,VII: smile symmetric, facial light touch sensation normal bilaterally VIII: hearing normal bilaterally IX,X: gag reflex present XI: trapezius strength/neck flexion strength normal bilaterally XII: tongue strength normal  Motor: Right : Upper extremity   5/5    Left:     Upper extremity   5/5  Lower extremity   5/5     Lower extremity   5/5 Tone and bulk:normal tone throughout; no atrophy noted  Sensory: Pinprick and light touch intact throughout, bilaterally Deep Tendon Reflexes: 2+ and symmetric throughout Plantars: Right: downgoing   Left: downgoing Cerebellar: normal finger-to-nose, normal rapid alternating movements and normal heel-to-shin test normal gait and station  Extrapyramidal: negative  Laboratory Studies: Basic Metabolic Panel:  Lab 02/14/12 1610  NA 139  K 3.4*  CL 100  CO2 28  GLUCOSE 100*  BUN 7    CREATININE 0.58  CALCIUM 9.5  MG --  PHOS --    Liver Function Tests:  Lab 02/14/12 2150  AST 23  ALT 22  ALKPHOS 84  BILITOT 0.9  PROT 7.1  ALBUMIN 3.6   No results found for this basename: LIPASE:5,AMYLASE:5 in the last 168 hours No results found for this basename: AMMONIA:3 in the last 168 hours  CBC:  Lab 02/14/12 2150  WBC 11.8*  NEUTROABS 7.5  HGB 14.3  HCT 40.6  MCV 84.4  PLT 329    Cardiac Enzymes:  Lab 02/14/12 2149  CKTOTAL 94  CKMB 1.8  CKMBINDEX --  TROPONINI --    BNP: No components found with this basename: POCBNP:5  CBG: No results found for this basename: GLUCAP:5 in the last 168 hours  Microbiology: No results found for this or any previous visit.  Coagulation Studies:  Basename 02/14/12 2150  LABPROT 13.4  INR 1.00    Urinalysis:  Lab 02/14/12 2105  COLORURINE YELLOW  LABSPEC 1.006  PHURINE 6.0  GLUCOSEU NEGATIVE  HGBUR NEGATIVE  BILIRUBINUR NEGATIVE  KETONESUR NEGATIVE  PROTEINUR NEGATIVE  UROBILINOGEN 0.2  NITRITE NEGATIVE  LEUKOCYTESUR NEGATIVE    Lipid Panel: No results found for this basename: chol, trig, hdl, cholhdl, vldl, ldlcalc    HgbA1C: No results found for this basename: HGBA1C    Urine Drug Screen:   No results found for this basename: labopia, cocainscrnur, labbenz, amphetmu, thcu, labbarb     Alcohol Level: No results found for this basename: ETH:2 in the last 168 hours  Other results: EKG: normal EKG, normal sinus rhythm, unchanged from previous tracings.  Imaging: Ct Head Wo Contrast  02/14/2012  *RADIOLOGY REPORT*  Clinical Data: Hypertension, altered mental status  CT HEAD WITHOUT CONTRAST  Technique:  Contiguous axial images were obtained from the base of the skull through the vertex without contrast.  Comparison: None.  Findings: Patchy periventricular and subcortical white matter hypoattenuation compatible with chronic microvascular ischemic changes.  No acute intracranial hemorrhage,  definite acute infarction, mass lesion, midline shift, herniation, hydrocephalus, or extra-axial fluid collection.  Cisterns patent.  No cerebellar abnormality.  Mastoids and sinuses remain clear.  Very minimal right ethmoid mucosal thickening, image #1.  IMPRESSION: Chronic microvascular ischemic changes in the deep white matter.  No acute finding by noncontrast CT  Original Report Authenticated By: Judie Petit. Ruel Favors, M.D.    Assessment/Plan  66 y/o RHWF with a known history of hypertension,  And she has been non compliant with the regimen and stopped taking her medications, since December comes with headaches, no other focal  Neurological deficits, she is afebrile Head CT; Negative for any etiology  Differential Diagnosis: 1) Hypertensive Encephalopathy 2) Tension headaches 3) Migraine without aura 4) Encephalitis 5) Venous sinus thrombosis   Plan:  1) Patient has elevated BP, start IV antihypertensive medications but do not ROBUSTLY drop her BP- high risk for ischemic stroke 2) Pain management 3) MRI of the brain 4) If her symptoms do not improve- LP  And send CSF for protein, glucose, gram stain, fungal and viral cultures,  cell counts.  Chandell Attridge V-P Eilleen Kempf., MD., Ph.D.,MS 01/06/2012, 12:54 PM  02/15/2012, 3:52 AM

## 2012-02-15 NOTE — Progress Notes (Signed)
Patient re assessed at 4:30 AM  bp now was  156/72 Patient is resting comfortable, headaches has improved significantly, no nausea no vomiting, no blurry vision She still appears to be confused and disoriented. No focal signs   I discussed with the husband, that if the MRI is negative for any etiology I would like to lumbar Puncture and  Send CSF for processing for meningitis.   At that point he let me know that he would like to discuss that with her PCP  Dr. Clovis Riley and get back with me Also explained the urgency  Of his decision is important but he was adamant that he would let me know after  he has talked to their PCP.  Even the RN- Kenney Houseman has explained to him the need of LP      Jaxx Huish V-P Eilleen Kempf., MD., Ph.D.,MS 01/06/2012, 12:54 PM  02/15/2012, 4:55 AM

## 2012-02-16 ENCOUNTER — Inpatient Hospital Stay (HOSPITAL_COMMUNITY): Payer: Medicare Other

## 2012-02-16 DIAGNOSIS — I639 Cerebral infarction, unspecified: Secondary | ICD-10-CM | POA: Diagnosis present

## 2012-02-16 DIAGNOSIS — I6350 Cerebral infarction due to unspecified occlusion or stenosis of unspecified cerebral artery: Principal | ICD-10-CM

## 2012-02-16 DIAGNOSIS — I69920 Aphasia following unspecified cerebrovascular disease: Secondary | ICD-10-CM

## 2012-02-16 DIAGNOSIS — I10 Essential (primary) hypertension: Secondary | ICD-10-CM | POA: Diagnosis not present

## 2012-02-16 DIAGNOSIS — I69320 Aphasia following cerebral infarction: Secondary | ICD-10-CM

## 2012-02-16 DIAGNOSIS — G9340 Encephalopathy, unspecified: Secondary | ICD-10-CM | POA: Diagnosis not present

## 2012-02-16 LAB — BASIC METABOLIC PANEL
BUN: 12 mg/dL (ref 6–23)
Chloride: 98 mEq/L (ref 96–112)
GFR calc Af Amer: >90 mL/min (ref >90)
GFR calc non Af Amer: >90 mL/min (ref >90)
Potassium: 3.5 mEq/L (ref 3.5–5.1)
Sodium: 139 mEq/L (ref 135–145)

## 2012-02-16 LAB — CBC
HCT: 39.9 % (ref 36.0–46.0)
Hemoglobin: 13.9 g/dL (ref 12.0–15.0)
MCHC: 34.8 g/dL (ref 30.0–36.0)
RDW: 13 % (ref 11.5–15.5)
WBC: 15.2 10*3/uL — ABNORMAL HIGH (ref 4.0–10.5)

## 2012-02-16 LAB — RAPID URINE DRUG SCREEN, HOSP PERFORMED: Amphetamines: NOT DETECTED

## 2012-02-16 LAB — GLUCOSE, CAPILLARY
Glucose-Capillary: 126 mg/dL — ABNORMAL HIGH (ref 70–99)
Glucose-Capillary: 124 mg/dL — ABNORMAL HIGH (ref 70–99)

## 2012-02-16 LAB — MAGNESIUM: Magnesium: 2.2 mg/dL (ref 1.5–2.5)

## 2012-02-16 MED ORDER — ASPIRIN EC 81 MG PO TBEC
81.0000 mg | DELAYED_RELEASE_TABLET | Freq: Every day | ORAL | Status: DC
  Administered 2012-02-16 – 2012-02-17 (×2): 81 mg via ORAL
  Filled 2012-02-16 (×2): qty 1

## 2012-02-16 MED ORDER — PNEUMOCOCCAL VAC POLYVALENT 25 MCG/0.5ML IJ INJ
0.5000 mL | INJECTION | INTRAMUSCULAR | Status: AC
  Administered 2012-02-16: 0.5 mL via INTRAMUSCULAR
  Filled 2012-02-16: qty 0.5

## 2012-02-16 NOTE — Progress Notes (Signed)
[facility] Progress Note [place] TEAM 1 - Stepdown/ICU TEAM   RONIA HAZELETT ZOX:[id number] DOB: 02/05/1946 DOA: 02/14/2012 PCP: Benita Stabile, MD  Brief narrative: 66 year old female with history of hypertension was brought to the ER by patient's husband as patient was getting increasingly confused. Patient per her husband had woken up and had breakfast. At around 10 AM patient started becoming increasingly confused with difficulty finding words to talk. Patient also was complaining of some left sided facial pain around the upper molars and left maxillary area. Patient did not lose consciousness or did not have any focal deficits. Did not complain of any blurred vision or nausea vomiting abdominal pain diarrhea neck pain chest pain or shortness of breath or dysuria. Patient is scheduled to have crowning of her left upper molar by this week. She had gone to her dentist last week and as per her husband patient was placed empirically on Keflex in preparation for the crowning. She had the dental implants placed 3 months ago. Denies having taken any pain medications. Patient did have a history of hypertension and has not been taking antihypertensives since last December due to the medications for blood pressure causing some gastric upset. In the ER patient was found to be confused. CT head did not show any acute. Neurologist was consulted and at this time neurologist feels her encephalopathy is related to uncontrolled hypertension.   Assessment/Plan:  Acute encephalopathy/altered mental status - possible small left posterior insula CVA Neurology is following - MRI positive for infarct- carotid dopplers and ECHO ordered. ASA and therapies ordered.   Uncontrolled hypertension Patient stopped taking her blood pressure medications in December 2012 - blood pressure is currently much better control - we will should avoid overaggressive correction given her long-term poor control- cont low dose  lisinopril for today  Left-sided facial pain  Hyperglycemia A1c does not meet criteria for a new diagnosis of diabetes - follow CBGs  Mild leukocytosis Likely due to stress de-margination but rising- no signs of infection-  recheck in a.m.  Modest hypokalemia Replaced  Hyperlipidemia STatin started  Code Status: Full Disposition Plan: transfer to neurotelem  Consultants: Neurology  Antibiotics: None  DVT prophylaxis: SCDs  HPI/Subjective: Patient is speaking today- mostly repeating the "everything is changed". Husband is optimistic about her regaining her speech and alertness.    Objective: Blood pressure 175/81, pulse 81, temperature 98.6 F (37 C), temperature source Oral, resp. rate 21, height 5\' 4"  (1.626 m), weight 58.6 kg (129 lb 3 oz), SpO2 95.00%.  Intake/Output Summary (Last 24 hours) at 02/16/12 1930 Last data filed at 02/16/12 1517  Gross per 24 hour  Intake    120 ml  Output    250 ml  Net   -130 ml     Exam:  Gen- alert, moderately flustered in trying to express her thoughts Lungs- CTA b/l CVA- RRR , no murmurs Abd- Soft, NT, ND, BS+ Ext- no c/c/e Neuro- expressive aphasia- following commands well, strength 5/5, CN 2-12 intact   Data Reviewed: Basic Metabolic Panel:  Lab 02/16/12 8119 02/15/12 0325 02/14/12 2150  NA 139 139 139  K 3.5 3.0* 3.4*  CL 98 97 100  CO2 31 26 28   GLUCOSE 137* 142* 100*  BUN 12 7 7   CREATININE 0.59 0.52 0.58  CALCIUM 9.6 9.2 9.5  MG 2.2 -- --  PHOS -- -- --   Liver Function Tests:  Lab 02/15/12 0325 02/14/12 2150  AST 20 23  ALT 20 22  ALKPHOS 83 84  BILITOT 1.2 0.9  PROT 6.9 7.1  ALBUMIN 3.6 3.6    Lab 02/15/12 0308  AMMONIA 30   CBC:  Lab 02/16/12 0425 02/14/12 2150  WBC 15.2* 11.8*  NEUTROABS -- 7.5  HGB 13.9 14.3  HCT 39.9 40.6  MCV 84.7 84.4  PLT 303 329   Cardiac Enzymes:  Lab 02/15/12 0300 02/14/12 2149  CKTOTAL 67 94  CKMB 1.2 1.8  CKMBINDEX -- --  TROPONINI <0.30 --     Recent Results (from the past 240 hour(s))  MRSA PCR SCREENING     Status: Normal   Collection Time   02/15/12  3:15 AM      Component Value Range Status Comment   MRSA by PCR NEGATIVE  NEGATIVE Final      Studies:  Recent x-ray studies have been reviewed in detail by the Attending Physician  Scheduled Meds:  Reviewed in detail by the Attending Physician    Calvert Cantor, MD [phone]  On-Call/Text Page:      [email or web address]      password [email or web address]  If 7PM-7AM, please contact night-coverage [email or web address] Password [email or web address] 02/16/2012, 7:30 PM   LOS: 2 days

## 2012-02-16 NOTE — Progress Notes (Signed)
Utilization review completed.  

## 2012-02-16 NOTE — Progress Notes (Signed)
Stroke Team Progress Note  HISTORY Brianna Snyder is an 66 y.o. RHWF with history of poorly controlled hypertension. She comes in with headaches and word finding difficulty since morning 02/15/2012, no other complaints. She stopped taking her antihypertensive medications in December and her BP remains poorly controlled. Her headaches are frontal and that radiates to the occipital area, no nausea, no vomiting, no diarrhea, no diplopia, no neck pain, no motor or sensory deficits, no ataxia, no dizziness, no tinnitus, no visual filed cuts, no history of snoring, No REM sleep disorder, No tick or mosquito bites. She has headaches on regular basis but today was worse. She woke up with a headache But decided not to come to the hospital until 10 pm She had checked her BP at home and SBP was in 190 - 200 and DBP >100. She has no speech impairment, no aphasia or dysarthria. She is on ABX prophylaxis for dental surgery next week. She was on scopolamine for vertigo when she went on a cruise about 2 weeks ago.   Patient was not a TPA candidate secondary to delay in arrival.  SUBJECTIVE Her husband is at the bedside.  Overall she feels her condition is gradually improving. More responsive today, but still aphasic. Pt stopped taking BP medications in Dec due to "stomach" issues.   OBJECTIVE Most recent Vital Signs: Filed Vitals:   02/16/12 0733 02/16/12 1036 02/16/12 1136 02/16/12 1517  BP:  175/81    Pulse:      Temp: 99.1 F (37.3 C)  99.3 F (37.4 C) 98.6 F (37 C)  TempSrc: Oral  Oral Oral  Resp:      Height:      Weight:      SpO2:       CBG (last 3)  Basename 02/16/12 1225 02/16/12 0815 02/15/12 2147  GLUCAP 111* 126* 125*   Intake/Output from previous day: 08/05 0701 - 08/06 0700 In: 220 [P.O.:220] Out: 176 [Urine:176]  IV Fluid Intake:     MEDICATIONS    . aspirin EC  81 mg Oral Daily  . atorvastatin  10 mg Oral q1800  . insulin aspart  0-9 Units Subcutaneous TID WC  .  lisinopril  5 mg Oral Daily  . pneumococcal 23 valent vaccine  0.5 mL Intramuscular Tomorrow-1000  . potassium chloride  20 mEq Oral BID  . DISCONTD: ondansetron (ZOFRAN) IV  4 mg Intravenous Q6H   PRN:  acetaminophen, labetalol, morphine injection, ondansetron (ZOFRAN) IV, oxyCODONE, DISCONTD: oxyCODONE  Diet:  Cardiac thin liquids Activity:   Bathroom privileges DVT Prophylaxis:  SCDs   CLINICALLY SIGNIFICANT STUDIES Basic Metabolic Panel:  Lab 02/16/12 1610 02/15/12 0325  NA 139 139  K 3.5 3.0*  CL 98 97  CO2 31 26  GLUCOSE 137* 142*  BUN 12 7  CREATININE 0.59 0.52  CALCIUM 9.6 9.2  MG 2.2 --  PHOS -- --   Liver Function Tests:  Lab 02/15/12 0325 02/14/12 2150  AST 20 23  ALT 20 22  ALKPHOS 83 84  BILITOT 1.2 0.9  PROT 6.9 7.1  ALBUMIN 3.6 3.6   CBC:  Lab 02/16/12 0425 02/14/12 2150  WBC 15.2* 11.8*  NEUTROABS -- 7.5  HGB 13.9 14.3  HCT 39.9 40.6  MCV 84.7 84.4  PLT 303 329   Coagulation:  Lab 02/14/12 2150  LABPROT 13.4  INR 1.00   Cardiac Enzymes:  Lab 02/15/12 0300 02/14/12 2149  CKTOTAL 67 94  CKMB 1.2 1.8  CKMBINDEX -- --  TROPONINI <0.30 --   Urinalysis:  Lab 02/14/12 2105  COLORURINE YELLOW  LABSPEC 1.006  PHURINE 6.0  GLUCOSEU NEGATIVE  HGBUR NEGATIVE  BILIRUBINUR NEGATIVE  KETONESUR NEGATIVE  PROTEINUR NEGATIVE  UROBILINOGEN 0.2  NITRITE NEGATIVE  LEUKOCYTESUR NEGATIVE   Lipid Panel    Component Value Date/Time   CHOL 209* 02/15/2012 0325   TRIG 289* 02/15/2012 0325   HDL 50 02/15/2012 0325   CHOLHDL 4.2 02/15/2012 0325   VLDL 58* 02/15/2012 0325   LDLCALC 101* 02/15/2012 0325   HgbA1C  Lab Results  Component Value Date   HGBA1C 5.9* 02/15/2012   Urine Drug Screen:   No results found for this basename: labopia,  cocainscrnur,  labbenz,  amphetmu,  thcu,  labbarb    Alcohol Level: No results found for this basename: ETH:2 in the last 168 hours  CT of the brain  02/14/2012   Chronic microvascular ischemic changes in the deep white  matter.  No acute finding by noncontrast CT  MRI of the brain  02/15/2012 1.  Sub centimeter focus of restricted diffusion in the left posterior insula most compatible with a small acute infarct.  No mass effect or hemorrhage. 2.  Underlying advanced but nonspecific widespread cerebral white matter signal changes. 3.  Possible small chronic lacunar infarct in the left thalamus.  MRA of the brain  02/15/2012 1.  Negative anterior circulation. 2.  Irregularity of the right PCA P1 segment might represent anatomic variation or atherosclerotic stenosis.  Preserved distal right PCA flow.    2D Echocardiogram    Carotid Doppler    CXR  02/15/2012  No acute cardiopulmonary disease on this AP portable examination.  EKG  .   Therapy Recommendations PT - ; OT -  Physical Exam    GENERAL EXAM: Patient is in no distress  CARDIOVASCULAR: Regular rate and rhythm, no murmurs, no carotid bruits  NEUROLOGIC: MENTAL STATUS: awake, alert; PLEASANT AND CONVERSANT, BUT STILL WITH SIGNIFICANT PERSEVERATION, PARAPHASIC ERRORS AND EXP APHASIA. NAMING POOR. COMPREHENSION FAIR TO SIMPLE COMMANDS, BUT NOT 2 STEP COMMANDS. CANNOT READ. CRANIAL NERVE: pupils equal and reactive to light, visual fields full to confrontation, extraocular muscles intact, no nystagmus, facial sensation and strength symmetric, uvula midline, shoulder shrug symmetric, tongue midline. MOTOR: normal bulk and tone, full strength in the BUE, BLE SENSORY: normal and symmetric to light touch COORDINATION: finger-nose-finger normal REFLEXES: deep tendon reflexes present and symmetric GAIT/STATION: not assessed  ASSESSMENT Brianna Snyder is a 66 y.o. female who presented Sunday evening with encephalopathy/confusion/word finding difficulty that started on Sunday in setting of malignant hypertension. MRI showed a small left posterior insular infarct. Infarct felt to be secondary to hypertension. She stopped taking her BP medications in December  due to "stoamch" side effects. On no antiplatlet prior to admission. Now on aspirin 81 mg orally every day for secondary stroke prevention. Patient with improving global aphasia and confusion.  -malignant hypertension, BP 190/102 on adm -hyperlipidemia, LDL 101, on statin -hyperglycemia, A1c 5.9 -left facial pain. Extensive dental work, including bone grafts and caps since Nov. Planned dental work next week. On prophylactic abx -leukocytosis, WBC 15.2 -hypokalemia, K 3.0  Hospital day # 2  TREATMENT/PLAN -Continue aspirin 81 mg orally every day for secondary stroke prevention. -ongoing BP management -agree with complete stroke workup to include 2D and CD -will add PT, OT and ST evals -ok for transfer to the floor from neuro standpoint  SHARON BIBY, MSN, RN, ANVP-BC, ANP-BC, GNP-BC [facility] Pager: [phone] 02/16/2012 3:35 PM  [facility] - Stroke Team Joycelyn Schmid, MD 02/16/2012, 6:47 PM   Please refer to [email or web address] for on-call Stroke MD

## 2012-02-17 DIAGNOSIS — I6789 Other cerebrovascular disease: Secondary | ICD-10-CM

## 2012-02-17 DIAGNOSIS — I6350 Cerebral infarction due to unspecified occlusion or stenosis of unspecified cerebral artery: Secondary | ICD-10-CM

## 2012-02-17 DIAGNOSIS — F29 Unspecified psychosis not due to a substance or known physiological condition: Secondary | ICD-10-CM | POA: Diagnosis not present

## 2012-02-17 DIAGNOSIS — I69920 Aphasia following unspecified cerebrovascular disease: Secondary | ICD-10-CM | POA: Diagnosis not present

## 2012-02-17 DIAGNOSIS — I10 Essential (primary) hypertension: Secondary | ICD-10-CM | POA: Diagnosis not present

## 2012-02-17 LAB — GLUCOSE, CAPILLARY: Glucose-Capillary: 118 mg/dL — ABNORMAL HIGH (ref 70–99)

## 2012-02-17 MED ORDER — LISINOPRIL 5 MG PO TABS: 5.0000 mg | ORAL_TABLET | Freq: Every day | ORAL | Status: DC

## 2012-02-17 MED ORDER — ASPIRIN 81 MG PO TBEC: 81.0000 mg | DELAYED_RELEASE_TABLET | Freq: Every day | ORAL | Status: AC

## 2012-02-17 MED ORDER — ATORVASTATIN CALCIUM 10 MG PO TABS: 10.0000 mg | ORAL_TABLET | Freq: Every day | ORAL | Status: DC

## 2012-02-17 NOTE — Discharge Summary (Signed)
Physician Discharge Summary  Brianna Snyder:[id number] DOB: 1945/08/29 DOA: 02/14/2012  PCP: Benita Stabile, MD  Admit date: 02/14/2012 Discharge date: 02/17/2012  Recommendations for Outpatient Follow-up:  1. Follow up with PCP in one week and check CBC .   Discharge Diagnoses:  Principal Problem:  *CVA (cerebral infarction) Active Problems:  Encephalopathy acute  Hypertension, uncontrolled  Aphasia S/P CVA LEUKOCYTOSIS   Discharge Condition: stable  Diet recommendation: low sodium diet  Wt Readings from Last 3 Encounters:  02/16/12 54.2 kg (119 lb 7.8 oz)  10/15/11 58.968 kg (130 lb)  09/02/11 60.782 kg (134 lb)    History of present illness:  66 year old female with history of hypertension was brought to the ER by patient's husband as patient was getting increasingly confused. Patient per her husband had woken up and had breakfast. At around 10 AM patient started becoming increasingly confused with difficulty finding words to talk. Patient also was complaining of some left sided facial pain around the upper molars and left maxillary area. Patient did not lose consciousness or did not have any focal deficits. Did not complain of any blurred vision or nausea vomiting abdominal pain diarrhea neck pain chest pain or shortness of breath or dysuria. Patient is scheduled to have crowning of her left upper molar by this week. She had gone to her dentist last week and as per her husband patient was placed empirically on Keflex in preparation for the crowning. She had the dental implants placed 3 months ago. Denies having taken any pain medications. Patient did have a history of hypertension and has not been taking antihypertensives since last December due to the medications for blood pressure causing some gastric upset. In the ER patient was found to be confused. CT head did not show any acute. MRI showed a small acute infarcta nd possible small lacunar chronic infarcts.  Neurologist  was consulted and she was started on antihypertensives and aspirin.     Hospital Course:  Acute encephalopathy/altered mental status - possible small left posterior insula CVA  - MRI positive for infarct- carotid dopplers and ECHO ordered and within normal lipids. Started her on aspirin and statin. RECOMMEND outpatient neurology follow up with Dr Pearlean Brownie in 2 months.   Uncontrolled hypertension  Patient stopped taking her blood pressure medications in December 2012 - blood pressure is currently much better control - we will should avoid overaggressive correction given her long-term poor control- cont low dose lisinopril for now and follow with PCP.    Hyperglycemia  A1c does not meet criteria for a new diagnosis of diabetes . Mild leukocytosis  Likely due to stress de-margination but rising- no signs of infection-  RECHECK in one week.  Modest hypokalemia  Replaced  Hyperlipidemia  STatin started   Procedures:  MRI BRAIN  ECHO  CAROTID DUPLEX  Consultations:  NEUROLOGY.  Discharge Exam: Filed Vitals:   02/17/12 1419  BP: 145/72  Pulse: 85  Temp: 98.4 F (36.9 C)  Resp: 20   Filed Vitals:   02/17/12 0209 02/17/12 0529 02/17/12 1033 02/17/12 1419  BP: 98/54 108/49 144/61 145/72  Pulse: 72 73 80 85  Temp: 98 F (36.7 C) 97.8 F (36.6 C) 98.1 F (36.7 C) 98.4 F (36.9 C)  TempSrc: Oral Oral Oral Oral  Resp: 20 20 20 20   Height:      Weight:      SpO2: 98% 98% 94% 97%    Gen- alert, moderately flustered in trying to express her thoughts  Lungs- CTA b/l  CVA- RRR , no murmurs  Abd- Soft, NT, ND, BS+  Ext- no c/c/e  Neuro- expressive aphasia- following commands well, strength 5/5, CN 2-12 intact  Discharge Instructions  Discharge Orders    Future Orders Please Complete By Expires   Diet - low sodium heart healthy      Discharge instructions      Comments:   Follow up with Dr Pearlean Brownie in 2 months   Activity as tolerated - No restrictions         Medication List  As of 02/17/2012  4:20 PM   STOP taking these medications         cephALEXin 500 MG capsule         TAKE these medications         aspirin 81 MG EC tablet   Take 1 tablet (81 mg total) by mouth daily.      atorvastatin 10 MG tablet   Commonly known as: LIPITOR   Take 1 tablet (10 mg total) by mouth daily at 6 PM.      lisinopril 5 MG tablet   Commonly known as: PRINIVIL,ZESTRIL   Take 1 tablet (5 mg total) by mouth daily.              The results of significant diagnostics from this hospitalization (including imaging, microbiology, ancillary and laboratory) are listed below for reference.    Significant Diagnostic Studies: Dg Orthopantogram  02/15/2012  *RADIOLOGY REPORT*  Clinical Data: Left lower tooth pain  ORTHOPANTOGRAM/PANORAMIC  Comparison: None.  Findings: Multiple missing teeth and restorations.  No definite caries or periapical lucency to suggest abscess.  Postop changes in the left maxilla.  Temporomandibular joints appear seated bilaterally.  No fracture evident.  IMPRESSION:  Postop changes without acute abnormality.  Recommend dental films for complete evaluation.  Original Report Authenticated By: Osa Craver, M.D.   Ct Head Wo Contrast  02/14/2012  *RADIOLOGY REPORT*  Clinical Data: Hypertension, altered mental status  CT HEAD WITHOUT CONTRAST  Technique:  Contiguous axial images were obtained from the base of the skull through the vertex without contrast.  Comparison: None.  Findings: Patchy periventricular and subcortical white matter hypoattenuation compatible with chronic microvascular ischemic changes.  No acute intracranial hemorrhage, definite acute infarction, mass lesion, midline shift, herniation, hydrocephalus, or extra-axial fluid collection.  Cisterns patent.  No cerebellar abnormality.  Mastoids and sinuses remain clear.  Very minimal right ethmoid mucosal thickening, image #1.  IMPRESSION: Chronic microvascular ischemic  changes in the deep white matter.  No acute finding by noncontrast CT  Original Report Authenticated By: Judie Petit. Ruel Favors, M.D.   Mr Maxine Glenn Head Wo Contrast  02/15/2012  *RADIOLOGY REPORT*  Clinical Data:  66 year old female with confusion, dysphagia, encephalopathy, stroke.  Comparison: Head CT 02/14/2012.  MRI HEAD WITHOUT CONTRAST  Technique: Multiplanar, multiecho pulse sequences of the brain and surrounding structures were obtained according to standard protocol without intravenous contrast.  Findings: Overall cerebral volume is normal. No midline shift, ventriculomegaly, mass effect, evidence of mass lesion, extra-axial collection or acute intracranial hemorrhage.  Cervicomedullary junction and pituitary are within normal limits.  There is a 6 mm focus of restricted diffusion in the left posterior insula.  No associated mass effect or hemorrhage.  Mild associated T2 hyperintensity.  Mildly heterogeneous diffusion signal elsewhere, no definite additional restricted diffusion.  Superimposed widespread patchy confluent cerebral white matter T2 and FLAIR hyperintensity.  Findings are most pronounced in the periatrial regions and  fairly symmetric.  No cortical encephalomalacia.  Major intracranial vascular flow voids are preserved.  The mild T2 heterogeneity in the deep gray matter nuclei, possible chronic lacunar infarct at the anterior left thalamus.  Brainstem and cerebellum are within normal limits for age.  Negative for age visualized cervical spine.  Normal bone marrow signal.  Visualized orbit soft tissues are within normal limits.  Mild mucosal thickening in the left maxillary and anterior right ethmoids.  Trace fluid in the mastoid air cells.  Negative visualized nasopharynx.  Negative scalp soft tissues.  IMPRESSION: 1.  Sub centimeter focus of restricted diffusion in the left posterior insula most compatible with a small acute infarct.  No mass effect or hemorrhage. 2.  Underlying advanced but nonspecific  widespread cerebral white matter signal changes. 3.  Possible small chronic lacunar infarct in the left thalamus. 4.  MRA findings below.  MRA HEAD WITHOUT CONTRAST  Technique: Angiographic images of the Circle of Willis were obtained using MRA technique without  intravenous contrast.  Findings: The antegrade flow in the posterior circulation with codominant distal vertebral arteries.  Normal PICA vessels.  Normal vertebrobasilar junction.  Normal basilar artery.  Normal AICA vessels.  Normal SCA and PCA origins.  Diminutive or absent posterior communicating arteries.  Tortuous right PCA P1 segment beyond the origin with moderate stenosis.  This may reflect atherosclerosis.  There is preserved distal right PCA flow.  Left PCA branches are within normal limits.  Antegrade flow in both ICA siphons.  No ICA stenosis.  Normal ophthalmic artery origins.  Normal carotid termini.  Normal MCA and ACA origins.  Median artery of the corpus callosum is present.  This may arise from a diminutive anterior communicating artery.  Visualized ACA branches are within normal limits.  MCA M1 segments are within normal limits.  Visualized bilateral MCA branches are within normal limits.  No left MCA sylvian division branch occlusion identified.  IMPRESSION: 1.  Negative anterior circulation. 2.  Irregularity of the right PCA P1 segment might represent anatomic variation or atherosclerotic stenosis.  Preserved distal right PCA flow.  Original Report Authenticated By: Harley Hallmark, M.D.   Mri Brain Without Contrast  02/15/2012  *RADIOLOGY REPORT*  Clinical Data:  66 year old female with confusion, dysphagia, encephalopathy, stroke.  Comparison: Head CT 02/14/2012.  MRI HEAD WITHOUT CONTRAST  Technique: Multiplanar, multiecho pulse sequences of the brain and surrounding structures were obtained according to standard protocol without intravenous contrast.  Findings: Overall cerebral volume is normal. No midline shift, ventriculomegaly,  mass effect, evidence of mass lesion, extra-axial collection or acute intracranial hemorrhage.  Cervicomedullary junction and pituitary are within normal limits.  There is a 6 mm focus of restricted diffusion in the left posterior insula.  No associated mass effect or hemorrhage.  Mild associated T2 hyperintensity.  Mildly heterogeneous diffusion signal elsewhere, no definite additional restricted diffusion.  Superimposed widespread patchy confluent cerebral white matter T2 and FLAIR hyperintensity.  Findings are most pronounced in the periatrial regions and fairly symmetric.  No cortical encephalomalacia.  Major intracranial vascular flow voids are preserved.  The mild T2 heterogeneity in the deep gray matter nuclei, possible chronic lacunar infarct at the anterior left thalamus.  Brainstem and cerebellum are within normal limits for age.  Negative for age visualized cervical spine.  Normal bone marrow signal.  Visualized orbit soft tissues are within normal limits.  Mild mucosal thickening in the left maxillary and anterior right ethmoids.  Trace fluid in the mastoid air cells.  Negative  visualized nasopharynx.  Negative scalp soft tissues.  IMPRESSION: 1.  Sub centimeter focus of restricted diffusion in the left posterior insula most compatible with a small acute infarct.  No mass effect or hemorrhage. 2.  Underlying advanced but nonspecific widespread cerebral white matter signal changes. 3.  Possible small chronic lacunar infarct in the left thalamus. 4.  MRA findings below.  MRA HEAD WITHOUT CONTRAST  Technique: Angiographic images of the Circle of Willis were obtained using MRA technique without  intravenous contrast.  Findings: The antegrade flow in the posterior circulation with codominant distal vertebral arteries.  Normal PICA vessels.  Normal vertebrobasilar junction.  Normal basilar artery.  Normal AICA vessels.  Normal SCA and PCA origins.  Diminutive or absent posterior communicating arteries.   Tortuous right PCA P1 segment beyond the origin with moderate stenosis.  This may reflect atherosclerosis.  There is preserved distal right PCA flow.  Left PCA branches are within normal limits.  Antegrade flow in both ICA siphons.  No ICA stenosis.  Normal ophthalmic artery origins.  Normal carotid termini.  Normal MCA and ACA origins.  Median artery of the corpus callosum is present.  This may arise from a diminutive anterior communicating artery.  Visualized ACA branches are within normal limits.  MCA M1 segments are within normal limits.  Visualized bilateral MCA branches are within normal limits.  No left MCA sylvian division branch occlusion identified.  IMPRESSION: 1.  Negative anterior circulation. 2.  Irregularity of the right PCA P1 segment might represent anatomic variation or atherosclerotic stenosis.  Preserved distal right PCA flow.  Original Report Authenticated By: Harley Hallmark, M.D.   Dg Chest Port 1 View  02/15/2012  *RADIOLOGY REPORT*  Clinical Data: Evaluate for pulmonary infiltrates  PORTABLE CHEST - 1 VIEW  Comparison: None.  Findings: Grossly normal cardiac silhouette and mediastinal contours given reduced lung volumes.  No focal airspace opacity. No definite pleural effusion or pneumothorax.  No acute osseous abnormalities.  IMPRESSION: No acute cardiopulmonary disease on this AP portable examination. Further evaluation with a PA and lateral chest radiograph may be obtained as clinically indicated.  Original Report Authenticated By: Waynard Reeds, M.D.    Microbiology: Recent Results (from the past 240 hour(s))  MRSA PCR SCREENING     Status: Normal   Collection Time   02/15/12  3:15 AM      Component Value Range Status Comment   MRSA by PCR NEGATIVE  NEGATIVE Final      Labs: Basic Metabolic Panel:  Lab 02/16/12 1610 02/15/12 0325 02/14/12 2150  NA 139 139 139  K 3.5 3.0* 3.4*  CL 98 97 100  CO2 31 26 28   GLUCOSE 137* 142* 100*  BUN 12 7 7   CREATININE 0.59 0.52 0.58   CALCIUM 9.6 9.2 9.5  MG 2.2 -- --  PHOS -- -- --   Liver Function Tests:  Lab 02/15/12 0325 02/14/12 2150  AST 20 23  ALT 20 22  ALKPHOS 83 84  BILITOT 1.2 0.9  PROT 6.9 7.1  ALBUMIN 3.6 3.6   No results found for this basename: LIPASE:5,AMYLASE:5 in the last 168 hours  Lab 02/15/12 0308  AMMONIA 30   CBC:  Lab 02/16/12 0425 02/14/12 2150  WBC 15.2* 11.8*  NEUTROABS -- 7.5  HGB 13.9 14.3  HCT 39.9 40.6  MCV 84.7 84.4  PLT 303 329   Cardiac Enzymes:  Lab 02/15/12 0300 02/14/12 2149  CKTOTAL 67 94  CKMB 1.2 1.8  CKMBINDEX -- --  TROPONINI <0.30 --   BNP: BNP (last 3 results) No results found for this basename: PROBNP:3 in the last 8760 hours CBG:  Lab 02/17/12 1122 02/17/12 0703 02/16/12 2037 02/16/12 1713 02/16/12 1225  GLUCAP 118* 98 134* 124* 111*    Time coordinating discharge: 50 minutes  Signed:  Ginamarie Banfield  [facility] 02/17/2012, 4:20 PM

## 2012-02-17 NOTE — Progress Notes (Signed)
Occupational Therapy Evaluation Patient Details Name: Brianna Snyder MRN: [id number] DOB: [birth year] Today's Date: 02/17/2012 Time: 2952-8413 OT Time Calculation (min): 17 min  OT Assessment / Plan / Recommendation Clinical Impression  Pt admitted with sudden word finding difficulties and confusion.  MRI revealed left posterior insula infarct. Pt presents near baseline level of functioning with BADLs and functional mobility but continues to demonstrate high level cognitive deficits.  Recommending 24/7 supervision for d/c home with husband. No OT f/u.    OT Assessment  Patient does not need any further OT services    Follow Up Recommendations  Supervision/Assistance - 24 hour;No OT follow up    Barriers to Discharge      Equipment Recommendations  None recommended by OT    Recommendations for Other Services Speech consult  Frequency       Precautions / Restrictions Precautions Precautions: None Restrictions Weight Bearing Restrictions: No   Pertinent Vitals/Pain See vitals    ADL  Grooming: Performed;Brushing hair;Independent Where Assessed - Grooming: Unsupported sitting Upper Body Dressing: Independent;Performed Where Assessed - Upper Body Dressing: Unsupported sitting Lower Body Dressing: Performed;Independent Where Assessed - Lower Body Dressing: Unsupported sit to stand Toilet Transfer: Performed;Modified independent Toilet Transfer Method: Sit to Barista: Other (comment) (EOB) Transfers/Ambulation Related to ADLs: Mod I ADL Comments: Pt at baseline with BADLs. Educated pt and pt's husband on recommendation that pt have 24/7 supervision initially due to decreased attention and higher level cognitive deficits.  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  Pt's husband verbalized understanding.     OT Diagnosis:    OT Problem  List:   OT Treatment Interventions:     OT Goals    Visit Information  Last OT Received On: 02/17/12 Assistance Needed: +1    Subjective Data      Prior Functioning  Vision/Perception  Home Living Lives With: Spouse Available Help at Discharge: Family;Available 24 hours/day Type of Home: House Home Access: Stairs to enter [employer] of Steps: 2 Entrance Stairs-Rails: None Home Layout: Able to live on main level with bedroom/bathroom;One level Bathroom Shower/Tub: Writer: None Prior Function Level of Independence: Independent Able to Take Stairs?: Reciprically Driving: Yes Vocation: Full time employment Communication Communication: Expressive difficulties Dominant Hand: Right      Cognition  Overall Cognitive Status: Impaired Area of Impairment: Attention;Following commands Arousal/Alertness: Awake/alert Orientation Level: Appears intact for tasks assessed Behavior During Session: [facility] for tasks performed Current Attention Level: Selective Following Commands: Follows multi-step commands with increased time Cognition - Other Comments: Pt needed demonstration for some higher level tasks secondary to difficulty comprehending.    Extremity/Trunk Assessment Right Upper Extremity Assessment RUE ROM/Strength/Tone: Within functional levels Left Upper Extremity Assessment LUE ROM/Strength/Tone: Within functional levels Right Lower Extremity Assessment RLE ROM/Strength/Tone: WFL for tasks assessed Left Lower Extremity Assessment LLE ROM/Strength/Tone: [facility] for tasks assessed   Mobility Bed Mobility Bed Mobility: Sitting - Scoot to Edge of Bed;Supine to Sit;Sit to Supine Supine to Sit: 7: Independent;HOB flat Sitting - Scoot to Edge of Bed: 7: Independent Sit to Supine: 7: Independent;HOB flat Transfers Transfers: Sit to Stand;Stand to Sit Sit to Stand: 6: Modified independent (Device/Increase  time);From bed Stand to Sit: 6: Modified independent (Device/Increase time);To bed Details for Transfer Assistance: Pt modified independent for transfers   Exercise    Balance Balance Balance Assessed:  No  End of Session OT - End of Session Activity Tolerance: Patient tolerated treatment well Patient left: in bed;with family/visitor present Nurse Communication: Other (comment) (OT signing off)  GO    02/17/2012 Cipriano Mile OTR/L Pager [phone] Office [phone]  Cipriano Mile 02/17/2012, 12:47 PM

## 2012-02-17 NOTE — Progress Notes (Signed)
Patient D/C instructions and stroke education provided to patient and husband. All questions answered to patient's satisfaction. Pt D/C home in no signs of acute distress.

## 2012-02-17 NOTE — Progress Notes (Signed)
Stroke Team Progress Note  HISTORY [name] is an 66 y.o. RHWF with history of poorly controlled hypertension. She comes in with headaches and word finding difficulty since morning 02/15/2012, no other complaints. She stopped taking her antihypertensive medications in December and her BP remains poorly controlled. Her headaches are frontal and that radiates to the occipital area, no nausea, no vomiting, no diarrhea, no diplopia, no neck pain, no motor or sensory deficits, no ataxia, no dizziness, no tinnitus, no visual filed cuts, no history of snoring, No REM sleep disorder, No tick or mosquito bites. She has headaches on regular basis but today was worse. She woke up with a headache But decided not to come to the hospital until 10 pm She had checked her BP at home and SBP was in 190 - 200 and DBP >100. She has no speech impairment, no aphasia or dysarthria. She is on ABX prophylaxis for dental surgery next week. She was on scopolamine for vertigo when she went on a cruise about 2 weeks ago.   Patient was not a TPA candidate secondary to delay in arrival.  SUBJECTIVE Her husband is at the bedside.  Overall she feels her condition is continuing to improve.   OBJECTIVE Most recent Vital Signs: Filed Vitals:   02/16/12 2120 02/17/12 0209 02/17/12 0529 02/17/12 1033  BP: 146/67 98/54 108/49 144/61  Pulse: 80 72 73 80  Temp: 98.2 F (36.8 C) 98 F (36.7 C) 97.8 F (36.6 C) 98.1 F (36.7 C)  TempSrc: Oral Oral Oral Oral  Resp: 18 20 20 20   Height: 5\' 2"  (1.575 m)     Weight: 54.2 kg (119 lb 7.8 oz)     SpO2: 96% 98% 98% 94%   CBG (last 3)   Basename 02/17/12 1122 02/17/12 0703 02/16/12 2037  GLUCAP 118* 98 134*   Intake/Output from previous day: 08/06 0701 - 08/07 0700 In: 360 [P.O.:360] Out: 450 [Urine:450]  IV Fluid Intake:     MEDICATIONS    . aspirin EC  81 mg Oral Daily  . atorvastatin  10 mg Oral q1800  . insulin aspart  0-9 Units Subcutaneous TID WC  .  lisinopril  5 mg Oral Daily  . pneumococcal 23 valent vaccine  0.5 mL Intramuscular Tomorrow-1000  . potassium chloride  20 mEq Oral BID   PRN:  acetaminophen, labetalol, morphine injection, ondansetron (ZOFRAN) IV, oxyCODONE  Diet:  Cardiac thin liquids Activity:   Bathroom privileges DVT Prophylaxis:  SCDs   CLINICALLY SIGNIFICANT STUDIES Basic Metabolic Panel:  Lab 02/16/12 8119 02/15/12 0325  NA 139 139  K 3.5 3.0*  CL 98 97  CO2 31 26  GLUCOSE 137* 142*  BUN 12 7  CREATININE 0.59 0.52  CALCIUM 9.6 9.2  MG 2.2 --  PHOS -- --   Liver Function Tests:  Lab 02/15/12 0325 02/14/12 2150  AST 20 23  ALT 20 22  ALKPHOS 83 84  BILITOT 1.2 0.9  PROT 6.9 7.1  ALBUMIN 3.6 3.6   CBC:  Lab 02/16/12 0425 02/14/12 2150  WBC 15.2* 11.8*  NEUTROABS -- 7.5  HGB 13.9 14.3  HCT 39.9 40.6  MCV 84.7 84.4  PLT 303 329   Coagulation:  Lab 02/14/12 2150  LABPROT 13.4  INR 1.00   Cardiac Enzymes:  Lab 02/15/12 0300 02/14/12 2149  CKTOTAL 67 94  CKMB 1.2 1.8  CKMBINDEX -- --  TROPONINI <0.30 --   Urinalysis:  Lab 02/14/12 2105  COLORURINE YELLOW  LABSPEC  1.006  PHURINE 6.0  GLUCOSEU NEGATIVE  HGBUR NEGATIVE  BILIRUBINUR NEGATIVE  KETONESUR NEGATIVE  PROTEINUR NEGATIVE  UROBILINOGEN 0.2  NITRITE NEGATIVE  LEUKOCYTESUR NEGATIVE   Lipid Panel    Component Value Date/Time   CHOL 209* 02/15/2012 0325   TRIG 289* 02/15/2012 0325   HDL 50 02/15/2012 0325   CHOLHDL 4.2 02/15/2012 0325   VLDL 58* 02/15/2012 0325   LDLCALC 101* 02/15/2012 0325   HgbA1C  Lab Results  Component Value Date   HGBA1C 5.9* 02/15/2012   Urine Drug Screen:      Component Value Date/Time   LABOPIA NONE DETECTED 02/16/2012 1920    Alcohol Level: No results found for this basename: ETH:2 in the last 168 hours  CT of the brain  02/14/2012   Chronic microvascular ischemic changes in the deep white matter.  No acute finding by noncontrast CT  MRI of the brain  02/15/2012 1.  Sub centimeter focus of restricted  diffusion in the left posterior insula most compatible with a small acute infarct.  No mass effect or hemorrhage. 2.  Underlying advanced but nonspecific widespread cerebral white matter signal changes. 3.  Possible small chronic lacunar infarct in the left thalamus.  MRA of the brain  02/15/2012 1.  Negative anterior circulation. 2.  Irregularity of the right PCA P1 segment might represent anatomic variation or atherosclerotic stenosis.  Preserved distal right PCA flow.    2D Echocardiogram    Carotid Doppler  No internal carotid artery stenosis bilaterally. Vertebrals with antegrade flow bilaterally.   CXR  02/15/2012  No acute cardiopulmonary disease on this AP portable examination.  Therapy Recommendat ions PT - none ; OT -  ; ST - OP  GENERAL EXAM:  The patient is alert and cooperative at the time of the examination.  The patient has full extraocular movements, the patient blinks to threat bilaterally.  The patient has significant difficulty with naming, but she has a very significant agnosia process as well, and she does not recognize obviously even when they are placed in her hands.  The patient has not had dysarthria.  The patient has good strength of the upper and lower extremities, no drift is seen.  Deep tendon reflexes are symmetric.  The patient has apraxia with finger-nose-finger and toe to finger, but she is able to perform these without ataxia.  The patient was not ambulated.  ASSESSMENT Brianna Snyder is a 66 y.o. female who presented Sunday evening with encephalopathy/confusion/word finding difficulty that started on Sunday in setting of malignant hypertension. MRI showed a small left posterior insular infarct. Infarct felt to be secondary to hypertension. She stopped taking her BP medications in December due to "stoamch" side effects. On no antiplatlet prior to admission. Now on aspirin 81 mg orally every day for secondary stroke prevention. Patient with improving  global aphasia and confusion.  -malignant hypertension, BP 190/102 on adm -hyperlipidemia, LDL 101, on statin -hyperglycemia, A1c 5.9 -left facial pain. Extensive dental work, including bone grafts and caps since Nov. Planned dental work next week. On prophylactic abx -leukocytosis, WBC 15.2 -hypokalemia, K 3.5  Hospital day # 3  The patient has sustained a deep left parietal stroke. Surprisingly, the patient has a significant problem with dysnomia, but also has significant agnosia and apraxia. Speech therapy is following this patient. Fourthly, the patient does not have significant motor deficits. The patient has had uncontrolled blood pressures. The patient will require aggressive blood pressure management following discharge. The patient will  be on antiplatelet agents. Stroke workup is complete with exception of the 2-D echocardiogram report. The patient will need ongoing speech therapy following discharge.  TREATMENT/PLAN -Continue aspirin 81 mg orally every day for secondary stroke prevention. -ongoing BP management -f/u 2D  -OP ST -follow up Dr. Pearlean Brownie in 2 mo  Annie Main, MSN, RN, ANVP-BC, ANP-BC, GNP-BC [facility] Pager: [phone] 02/17/2012 11:38 AM  Scribe for Dr. Lesia Sago who has personally reviewed chart, pertinent data, examined the patient and developed the plan of care.   Lesly Dukes

## 2012-02-17 NOTE — Progress Notes (Signed)
OT Cancellation Note  Treatment cancelled today due to patient receiving procedure or test (echo).  Will re-attempt later this AM when pt returns.  Thanks.  02/17/2012 Cipriano Mile OTR/L Pager [phone] Office [phone]

## 2012-02-17 NOTE — Evaluation (Signed)
Physical Therapy Evaluation Patient Details Name: Brianna Snyder MRN: [id number] DOB: July 17, 1945 Today's Date: 02/17/2012 Time: 4098-1191 PT Time Calculation (min): 23 min  PT Assessment / Plan / Recommendation Clinical Impression  Pt is a 66 yo female who was admitted with headaches and word finding difficulty since morning 02/15/2012, no other complaints. Per MD note, small left insular CVA. Pt presented to physical therapy evaluation with no general mobility issues and was able to perform high level balance activities without LOB. Pt and her husband were educated on signs and symptoms of a stroke and encouraged to begin a regular walking program after discharge. Pt's husband will be available 24 hours after discharge, so support is adequate. No further PT needs at this time.    PT Assessment  Patent does not need any further PT services    Follow Up Recommendations  No PT follow up    Barriers to Discharge        Equipment Recommendations  None recommended by PT    Recommendations for Other Services     Frequency      Precautions / Restrictions Precautions Precautions: None Restrictions Weight Bearing Restrictions: No         Mobility  Bed Mobility Bed Mobility: Sitting - Scoot to Edge of Bed Sitting - Scoot to Edge of Bed: 7: Independent Transfers Transfers: Sit to Stand;Stand to Sit Sit to Stand: 6: Modified independent (Device/Increase time) Stand to Sit: 6: Modified independent (Device/Increase time) Details for Transfer Assistance: Pt modified independent for transfers Ambulation/Gait Ambulation/Gait Assistance: 6: Modified independent (Device/Increase time) Ambulation Distance (Feet): 400 Feet Assistive device: None Gait Pattern: Within Functional Limits Stairs: Yes Stairs Assistance: 5: Supervision Stairs Assistance Details (indicate cue type and reason): supervision for safety Stair Management Technique: No rails Number of Stairs: 5  Modified Rankin  (Stroke Patients Only) Modified Rankin: No significant disability    Exercises     PT Diagnosis:    PT Problem List:   PT Treatment Interventions:     PT Goals    Visit Information  Last PT Received On: 02/17/12 Assistance Needed: +1    Subjective Data  Subjective: "I am fine."   Prior Functioning  Home Living Lives With: Spouse Available Help at Discharge: Family Type of Home: House Home Access: Stairs to enter Secretary/administrator of Steps: 2 Entrance Stairs-Rails: None Home Layout: Able to live on main level with bedroom/bathroom;One level Bathroom Shower/Tub: Writer: None Prior Function Level of Independence: Independent Able to Take Stairs?: Reciprically Driving: Yes Vocation: Full time employment Communication Communication: Expressive difficulties;Receptive difficulties Dominant Hand: Right    Cognition  Overall Cognitive Status: Impaired Area of Impairment: Attention;Following commands Arousal/Alertness: Awake/alert Orientation Level: Appears intact for tasks assessed Behavior During Session: [facility] for tasks performed Current Attention Level: Selective Following Commands: Follows multi-step commands with increased time Cognition - Other Comments: Pt needed demonstration for some higher level tasks secondary to difficulty comprehending.    Extremity/Trunk Assessment Right Upper Extremity Assessment RUE ROM/Strength/Tone: [facility] for tasks assessed Left Upper Extremity Assessment LUE ROM/Strength/Tone: WFL for tasks assessed Right Lower Extremity Assessment RLE ROM/Strength/Tone: WFL for tasks assessed Left Lower Extremity Assessment LLE ROM/Strength/Tone: WFL for tasks assessed   Balance Balance Balance Assessed: Yes Standardized Balance Assessment Standardized Balance Assessment: Dynamic Gait Index Dynamic Gait Index Level Surface: Normal Change in Gait Speed: Normal Gait with Horizontal  Head Turns: Normal Gait with Vertical Head Turns: Normal Gait and Pivot Turn: Normal Step  Over Obstacle: Normal Step Around Obstacles: Mild Impairment Steps: Normal Total Score: 23  High Level Balance High Level Balance Activites: Backward walking;Direction changes;Sudden stops High Level Balance Comments: Pt able to perform without LOB.  End of Session PT - End of Session Equipment Utilized During Treatment: Gait belt Activity Tolerance: Patient tolerated treatment well Patient left: in bed;with call bell/phone within reach;with family/visitor present  GP     Siria Calandro 02/17/2012, 12:36 PM

## 2012-02-17 NOTE — Evaluation (Signed)
I have read and agree with the below assessment. Loza Prell Helen Whitlow PT, DPT Pager: [phone] 

## 2012-02-17 NOTE — Progress Notes (Signed)
  Echocardiogram 2D Echocardiogram has been performed.  Kendre Sires 02/17/2012, 10:49 AM

## 2012-02-17 NOTE — Progress Notes (Signed)
VASCULAR LAB PRELIMINARY  PRELIMINARY  PRELIMINARY  PRELIMINARY  Carotid duplex completed.    Preliminary report:  Bilateral:  No evidence of hemodynamically significant internal carotid artery stenosis.   Vertebral artery flow is antegrade.     Jennah Satchell, RVS 02/17/2012, 10:22 AM

## 2012-02-17 NOTE — Evaluation (Signed)
Speech Language Pathology Evaluation Patient Details Name: Brianna Snyder MRN: [id number] DOB: 10-20-1945 Today's Date: 02/17/2012 Time: 4098-1191 SLP Time Calculation (min): 27 min  Problem List:  Patient Active Problem List  Diagnosis  . Encephalopathy acute  . Hypertension, uncontrolled  . Aphasia S/P CVA  . CVA (cerebral infarction)   Past Medical History:  Past Medical History  Diagnosis Date  . Hypertension    Past Surgical History:  Past Surgical History  Procedure Date  . Tubal ligation   . Cholecystectomy 2013   HPI:  Pleasant 66 yr old with sudden word finding difficulties and confusion.  PMH:  HTN.  MRI revealed left posterior insula infarct.    Assessment / Plan / Recommendation Clinical Impression  Pt. presents with a mixed aphasia characterized by expressive deficits greater than receptive.  Expressive errors are characterized by anomia, difficulty with divergent naming, pauses at sentence level, extra processing time required.  Pt. exhibits difficutly comprehending certain words in the sentence thus preventing full understaning of the intended message.  Recommend continued ST in acute care and would benefit from outpatient ST to facilitate communication impairments.     SLP Assessment  Patient needs continued Speech Lanaguage Pathology Services    Follow Up Recommendations  Outpatient SLP    Frequency and Duration min 2x/week  2 weeks       SLP Goals  SLP Goals Potential to Achieve Goals: Good Progress/Goals/Alternative treatment plan discussed with pt/caregiver and they: Agree SLP Goal #1: Pt. will use strategies (description, synonyms etc) to facilitate word finding in sentences with min verbal assist. SLP Goal #2: Pt. will demonstrate increased comprehension of 2 step commands in functional activities and verbal information with min verbal cues. SLP Goal #3: Pt. will improve accuracy of written information to dictation with min visual assist.  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)   Cognition  Overall Cognitive Status: Impaired Arousal/Alertness: Awake/alert Orientation Level: Oriented X4 Attention:  (suspect difficulty with higher level cognitive tasks) Memory:  (assess further) Awareness: Appears intact Problem Solving: Appears intact Executive Function: Organizing;Sequencing;Self Correcting;Self Monitoring Organizing: Impaired Organizing Impairment: Verbal complex Self Monitoring: Impaired Self Monitoring Impairment: Verbal complex Self Correcting: Impaired Self Correcting Impairment: Verbal complex Safety/Judgment: Appears intact    Comprehension  Auditory Comprehension Overall Auditory Comprehension: Impaired Yes/No Questions: Within Functional Limits Commands: Impaired Two Step Basic Commands: 50-74% accurate Multistep Basic Commands: 0-24% accurate Conversation: Complex Interfering Components: Processing speed EffectiveTechniques: Extra processing time Visual Recognition/Discrimination Discrimination: Not tested Reading Comprehension Reading Status: Not tested    Expression Expression Primary Mode of Expression: Verbal Verbal Expression Overall Verbal Expression: Impaired Initiation: No impairment Level of Generative/Spontaneous Verbalization: Sentence Repetition: No impairment Naming: Impairment Confrontation: Impaired Convergent: 75-100% accurate Divergent: 0-24% accurate Verbal Errors:  (aware of errors 75% of the time) Pragmatics: No impairment Written Expression Dominant Hand: Right Written Expression: Exceptions to [facility] Self Formulation Ability: Word   Oral / Motor Oral Motor/Sensory Function Overall Oral Motor/Sensory Function: Impaired (apraxia) Motor Speech Overall Motor Speech: Appears within functional limits  for tasks assessed Respiration: Within functional limits Phonation: Normal Resonance: Within functional limits Articulation: Within functional limitis Intelligibility: Intelligible Motor Planning: Witnin functional limits   [employer] M.Ed [employer] [phone]  02/17/2012

## 2012-02-18 NOTE — Progress Notes (Signed)
CARE MANAGEMENT NOTE 02/18/2012  Patient:  Brianna Snyder, Brianna Snyder   Account Number:  [id number]  Date Initiated:  02/16/2012  Documentation initiated by:  Donn Pierini  Subjective/Objective Assessment:   Pt admitted with Acute encephalopathy/altered mental status - possible small left  posterior insula CVA     Action/Plan:   PTA pt lived at home with spouse   Anticipated DC Date:  02/18/2012   Anticipated DC Plan:  HOME/SELF CARE      DC Planning Services  CM consult      Choice offered to / List presented to:          [facility] arranged  HH-5 SPEECH THERAPY      Status of service:  Completed, signed off Medicare Important Message given?   (If response is "NO", the following Medicare IM given date fields will be blank) Date Medicare IM given:   Date Additional Medicare IM given:    Discharge Disposition:  HOME W HOME HEALTH SERVICES  Per UR Regulation:  Reviewed for med. necessity/level of care/duration of stay  If discussed at Long Length of Stay Meetings, dates discussed:    Comments:  02/18/12 Vance Peper, RN BSN Case Manager CM faxed orders and pertinent information to [facility] outpt therapy. They will contact patient for appointment date and time.

## 2012-02-25 DIAGNOSIS — I6350 Cerebral infarction due to unspecified occlusion or stenosis of unspecified cerebral artery: Secondary | ICD-10-CM | POA: Diagnosis not present

## 2012-02-25 DIAGNOSIS — IMO0001 Reserved for inherently not codable concepts without codable children: Secondary | ICD-10-CM | POA: Diagnosis not present

## 2012-02-25 DIAGNOSIS — R41841 Cognitive communication deficit: Secondary | ICD-10-CM | POA: Diagnosis not present

## 2012-03-04 DIAGNOSIS — R5381 Other malaise: Secondary | ICD-10-CM | POA: Diagnosis not present

## 2012-03-04 DIAGNOSIS — E782 Mixed hyperlipidemia: Secondary | ICD-10-CM | POA: Diagnosis not present

## 2012-03-04 DIAGNOSIS — I10 Essential (primary) hypertension: Secondary | ICD-10-CM | POA: Diagnosis not present

## 2012-03-04 DIAGNOSIS — I679 Cerebrovascular disease, unspecified: Secondary | ICD-10-CM | POA: Diagnosis not present

## 2012-03-09 DIAGNOSIS — R41841 Cognitive communication deficit: Secondary | ICD-10-CM | POA: Diagnosis not present

## 2012-03-09 DIAGNOSIS — I6350 Cerebral infarction due to unspecified occlusion or stenosis of unspecified cerebral artery: Secondary | ICD-10-CM | POA: Diagnosis not present

## 2012-03-09 DIAGNOSIS — IMO0001 Reserved for inherently not codable concepts without codable children: Secondary | ICD-10-CM | POA: Diagnosis not present

## 2012-03-17 DIAGNOSIS — I679 Cerebrovascular disease, unspecified: Secondary | ICD-10-CM | POA: Diagnosis not present

## 2012-03-17 DIAGNOSIS — E782 Mixed hyperlipidemia: Secondary | ICD-10-CM | POA: Diagnosis not present

## 2012-03-17 DIAGNOSIS — I10 Essential (primary) hypertension: Secondary | ICD-10-CM | POA: Diagnosis not present

## 2012-04-06 ENCOUNTER — Ambulatory Visit (INDEPENDENT_AMBULATORY_CARE_PROVIDER_SITE_OTHER): Payer: Medicare Other | Admitting: Obstetrics and Gynecology

## 2012-04-06 ENCOUNTER — Encounter: Payer: Self-pay | Admitting: Obstetrics and Gynecology

## 2012-04-06 VITALS — BP 132/68 | HR 86 | Ht 62.5 in | Wt 129.0 lb

## 2012-04-06 DIAGNOSIS — Z124 Encounter for screening for malignant neoplasm of cervix: Secondary | ICD-10-CM

## 2012-04-06 DIAGNOSIS — Z01419 Encounter for gynecological examination (general) (routine) without abnormal findings: Secondary | ICD-10-CM | POA: Diagnosis not present

## 2012-04-06 MED ORDER — ESTRADIOL 10 MCG VA TABS: 1.0000 | ORAL_TABLET | VAGINAL | Status: AC

## 2012-04-06 NOTE — Progress Notes (Signed)
The patient is not taking hormone replacement therapy The patient  is not taking a Calcium supplement. Post-menopausal bleeding:no  Last Pap: was normal February  2011 Last mammogram: was normal February  2013 Last DEXA scan : Pt had D[facility] done at [facility] approx 10 years ago Last colonoscopy:1 benign polyp removed October 2012  Urinary symptoms: none Normal bowel movements: Yes Reports abuse at home: No:    Subjective:    Brianna Snyder is a 66 y.o. female G1P1 who presents for annual exam.  The patient complains of right sided pain and vaginal dryness.  The following portions of the patient's history were reviewed and updated as appropriate: allergies, current medications, past family history, past medical history, past social history, past surgical history and problem list.  Review of Systems Pertinent items are noted in HPI. Gastrointestinal:No change in bowel habits, no abdominal pain, no rectal bleeding Genitourinary:negative for dysuria, frequency, hematuria, nocturia and urinary incontinence    Objective:     BP 132/68  Pulse 86  Ht 5' 2.5" (1.588 m)  Wt 129 lb (58.514 kg)  BMI 23.22 kg/m2  Weight:  Wt Readings from Last 1 Encounters:  04/06/12 129 lb (58.514 kg)     BMI: Body mass index is 23.22 kg/(m^2). General Appearance: Alert, appropriate appearance for age. No acute distress HEENT: Grossly normal Neck / Thyroid: Supple, no masses, nodes or enlargement Lungs: clear to auscultation bilaterally Back: No CVA tenderness Breast Exam: No masses or nodes.No dimpling, nipple retraction or discharge. Cardiovascular: Regular rate and rhythm. S1, S2, no murmur Gastrointestinal: Soft, non-tender, no masses or organomegaly Pelvic Exam: Vulva and vagina appear normal. Bimanual exam reveals normal uterus and adnexa. Rectovaginal: normal rectal, no masses Lymphatic Exam: Non-palpable nodes in neck, clavicular, axillary, or inguinal regions Skin: no rash or  abnormalities Neurologic: Normal gait and speech, no tremor  Psychiatric: Alert and oriented, appropriate affect.     Assessment:    Normal gyn exam    Plan:   pap smear return annually or prn Follow-up:  for annual exam Sch for DEXA scan

## 2012-04-07 ENCOUNTER — Telehealth: Payer: Self-pay | Admitting: Obstetrics and Gynecology

## 2012-04-07 LAB — PAP IG W/ RFLX HPV ASCU

## 2012-04-07 NOTE — Telephone Encounter (Signed)
LD, this pt saw SR yesterday, told her a type of lubricant to get for vaginal dryness and accidentally deleted out of phone, can you get that information from her?  Thanks

## 2012-04-07 NOTE — Telephone Encounter (Signed)
LM for pt that the two things recommended for vaginal dryness were Replens and [place] intrique.  To with questions.  ld

## 2012-05-11 DIAGNOSIS — L219 Seborrheic dermatitis, unspecified: Secondary | ICD-10-CM | POA: Diagnosis not present

## 2012-05-11 DIAGNOSIS — Z79899 Other long term (current) drug therapy: Secondary | ICD-10-CM | POA: Diagnosis not present

## 2012-05-11 DIAGNOSIS — I10 Essential (primary) hypertension: Secondary | ICD-10-CM | POA: Diagnosis not present

## 2012-05-11 DIAGNOSIS — I679 Cerebrovascular disease, unspecified: Secondary | ICD-10-CM | POA: Diagnosis not present

## 2012-05-11 DIAGNOSIS — E782 Mixed hyperlipidemia: Secondary | ICD-10-CM | POA: Diagnosis not present

## 2012-05-11 DIAGNOSIS — Z23 Encounter for immunization: Secondary | ICD-10-CM | POA: Diagnosis not present

## 2012-05-11 DIAGNOSIS — D72829 Elevated white blood cell count, unspecified: Secondary | ICD-10-CM | POA: Diagnosis not present

## 2012-05-11 DIAGNOSIS — L821 Other seborrheic keratosis: Secondary | ICD-10-CM | POA: Diagnosis not present

## 2012-06-06 DIAGNOSIS — H2510 Age-related nuclear cataract, unspecified eye: Secondary | ICD-10-CM | POA: Diagnosis not present

## 2012-06-06 DIAGNOSIS — IMO0002 Reserved for concepts with insufficient information to code with codable children: Secondary | ICD-10-CM | POA: Diagnosis not present

## 2012-06-15 DIAGNOSIS — H2510 Age-related nuclear cataract, unspecified eye: Secondary | ICD-10-CM | POA: Diagnosis not present

## 2012-06-22 DIAGNOSIS — H2510 Age-related nuclear cataract, unspecified eye: Secondary | ICD-10-CM | POA: Diagnosis not present

## 2012-06-23 DIAGNOSIS — H2510 Age-related nuclear cataract, unspecified eye: Secondary | ICD-10-CM | POA: Diagnosis not present

## 2012-06-29 DIAGNOSIS — H2510 Age-related nuclear cataract, unspecified eye: Secondary | ICD-10-CM | POA: Diagnosis not present

## 2012-08-01 DIAGNOSIS — M6530 Trigger finger, unspecified finger: Secondary | ICD-10-CM | POA: Diagnosis not present

## 2012-08-16 DIAGNOSIS — E782 Mixed hyperlipidemia: Secondary | ICD-10-CM | POA: Diagnosis not present

## 2012-08-29 DIAGNOSIS — M6530 Trigger finger, unspecified finger: Secondary | ICD-10-CM | POA: Diagnosis not present

## 2012-10-13 DIAGNOSIS — Z961 Presence of intraocular lens: Secondary | ICD-10-CM | POA: Diagnosis not present

## 2012-10-18 ENCOUNTER — Other Ambulatory Visit: Payer: Self-pay

## 2012-10-18 DIAGNOSIS — Z1231 Encounter for screening mammogram for malignant neoplasm of breast: Secondary | ICD-10-CM

## 2012-10-21 DIAGNOSIS — H0289 Other specified disorders of eyelid: Secondary | ICD-10-CM | POA: Diagnosis not present

## 2012-11-09 DIAGNOSIS — G478 Other sleep disorders: Secondary | ICD-10-CM | POA: Diagnosis not present

## 2012-11-09 DIAGNOSIS — I10 Essential (primary) hypertension: Secondary | ICD-10-CM | POA: Diagnosis not present

## 2012-11-09 DIAGNOSIS — I679 Cerebrovascular disease, unspecified: Secondary | ICD-10-CM | POA: Diagnosis not present

## 2012-11-09 DIAGNOSIS — E782 Mixed hyperlipidemia: Secondary | ICD-10-CM | POA: Diagnosis not present

## 2012-11-09 DIAGNOSIS — R05 Cough: Secondary | ICD-10-CM | POA: Diagnosis not present

## 2012-11-09 DIAGNOSIS — J309 Allergic rhinitis, unspecified: Secondary | ICD-10-CM | POA: Diagnosis not present

## 2012-11-23 DIAGNOSIS — R05 Cough: Secondary | ICD-10-CM | POA: Diagnosis not present

## 2012-11-23 DIAGNOSIS — G478 Other sleep disorders: Secondary | ICD-10-CM | POA: Diagnosis not present

## 2012-11-23 DIAGNOSIS — I10 Essential (primary) hypertension: Secondary | ICD-10-CM | POA: Diagnosis not present

## 2012-11-23 DIAGNOSIS — I679 Cerebrovascular disease, unspecified: Secondary | ICD-10-CM | POA: Diagnosis not present

## 2012-11-23 DIAGNOSIS — E782 Mixed hyperlipidemia: Secondary | ICD-10-CM | POA: Diagnosis not present

## 2012-11-23 DIAGNOSIS — J309 Allergic rhinitis, unspecified: Secondary | ICD-10-CM | POA: Diagnosis not present

## 2012-11-24 DIAGNOSIS — Z961 Presence of intraocular lens: Secondary | ICD-10-CM | POA: Diagnosis not present

## 2012-11-28 ENCOUNTER — Ambulatory Visit: Payer: Medicare Other

## 2012-11-30 DIAGNOSIS — H26499 Other secondary cataract, unspecified eye: Secondary | ICD-10-CM | POA: Diagnosis not present

## 2012-12-08 ENCOUNTER — Ambulatory Visit: Payer: Medicare Other

## 2012-12-14 DIAGNOSIS — H26499 Other secondary cataract, unspecified eye: Secondary | ICD-10-CM | POA: Diagnosis not present

## 2013-05-11 DIAGNOSIS — L820 Inflamed seborrheic keratosis: Secondary | ICD-10-CM | POA: Diagnosis not present

## 2013-05-11 DIAGNOSIS — L909 Atrophic disorder of skin, unspecified: Secondary | ICD-10-CM | POA: Diagnosis not present

## 2013-05-11 DIAGNOSIS — L739 Follicular disorder, unspecified: Secondary | ICD-10-CM | POA: Diagnosis not present

## 2013-05-12 ENCOUNTER — Ambulatory Visit
Admission: RE | Admit: 2013-05-12 | Discharge: 2013-05-12 | Disposition: A | Discharge status: Home / Self Care | Payer: Medicare Other | Source: Ambulatory Visit

## 2013-05-12 DIAGNOSIS — J309 Allergic rhinitis, unspecified: Secondary | ICD-10-CM | POA: Diagnosis not present

## 2013-05-12 DIAGNOSIS — R05 Cough: Secondary | ICD-10-CM | POA: Diagnosis not present

## 2013-05-12 DIAGNOSIS — Z23 Encounter for immunization: Secondary | ICD-10-CM | POA: Diagnosis not present

## 2013-05-12 DIAGNOSIS — Z1331 Encounter for screening for depression: Secondary | ICD-10-CM | POA: Diagnosis not present

## 2013-05-12 DIAGNOSIS — E782 Mixed hyperlipidemia: Secondary | ICD-10-CM | POA: Diagnosis not present

## 2013-05-12 DIAGNOSIS — Z0000 Encounter for general adult medical examination without abnormal findings: Secondary | ICD-10-CM | POA: Diagnosis not present

## 2013-05-12 DIAGNOSIS — Z1231 Encounter for screening mammogram for malignant neoplasm of breast: Secondary | ICD-10-CM | POA: Diagnosis not present

## 2013-05-12 DIAGNOSIS — I10 Essential (primary) hypertension: Secondary | ICD-10-CM | POA: Diagnosis not present

## 2013-05-12 DIAGNOSIS — Z79899 Other long term (current) drug therapy: Secondary | ICD-10-CM | POA: Diagnosis not present

## 2013-05-12 DIAGNOSIS — G478 Other sleep disorders: Secondary | ICD-10-CM | POA: Diagnosis not present

## 2013-05-19 DIAGNOSIS — Z01419 Encounter for gynecological examination (general) (routine) without abnormal findings: Secondary | ICD-10-CM | POA: Diagnosis not present

## 2013-05-19 DIAGNOSIS — N898 Other specified noninflammatory disorders of vagina: Secondary | ICD-10-CM | POA: Diagnosis not present

## 2013-05-19 DIAGNOSIS — M899 Disorder of bone, unspecified: Secondary | ICD-10-CM | POA: Diagnosis not present

## 2013-05-19 DIAGNOSIS — R1031 Right lower quadrant pain: Secondary | ICD-10-CM | POA: Diagnosis not present

## 2013-05-19 DIAGNOSIS — Z124 Encounter for screening for malignant neoplasm of cervix: Secondary | ICD-10-CM | POA: Diagnosis not present

## 2013-05-29 DIAGNOSIS — Z961 Presence of intraocular lens: Secondary | ICD-10-CM | POA: Diagnosis not present

## 2013-07-24 DIAGNOSIS — J069 Acute upper respiratory infection, unspecified: Secondary | ICD-10-CM | POA: Diagnosis not present

## 2013-07-24 DIAGNOSIS — J111 Influenza due to unidentified influenza virus with other respiratory manifestations: Secondary | ICD-10-CM | POA: Diagnosis not present

## 2013-11-09 DIAGNOSIS — I679 Cerebrovascular disease, unspecified: Secondary | ICD-10-CM | POA: Diagnosis not present

## 2013-11-09 DIAGNOSIS — R1013 Epigastric pain: Secondary | ICD-10-CM | POA: Diagnosis not present

## 2013-11-09 DIAGNOSIS — R197 Diarrhea, unspecified: Secondary | ICD-10-CM | POA: Diagnosis not present

## 2013-11-09 DIAGNOSIS — D72829 Elevated white blood cell count, unspecified: Secondary | ICD-10-CM | POA: Diagnosis not present

## 2013-11-09 DIAGNOSIS — I10 Essential (primary) hypertension: Secondary | ICD-10-CM | POA: Diagnosis not present

## 2013-11-09 DIAGNOSIS — G478 Other sleep disorders: Secondary | ICD-10-CM | POA: Diagnosis not present

## 2013-11-09 DIAGNOSIS — E782 Mixed hyperlipidemia: Secondary | ICD-10-CM | POA: Diagnosis not present

## 2013-11-09 DIAGNOSIS — J309 Allergic rhinitis, unspecified: Secondary | ICD-10-CM | POA: Diagnosis not present

## 2013-12-11 DIAGNOSIS — R1013 Epigastric pain: Secondary | ICD-10-CM | POA: Diagnosis not present

## 2013-12-11 DIAGNOSIS — R197 Diarrhea, unspecified: Secondary | ICD-10-CM | POA: Diagnosis not present

## 2013-12-11 DIAGNOSIS — E782 Mixed hyperlipidemia: Secondary | ICD-10-CM | POA: Diagnosis not present

## 2013-12-11 DIAGNOSIS — E781 Pure hyperglyceridemia: Secondary | ICD-10-CM | POA: Diagnosis not present

## 2014-02-05 DIAGNOSIS — E782 Mixed hyperlipidemia: Secondary | ICD-10-CM | POA: Diagnosis not present

## 2014-02-05 DIAGNOSIS — R1013 Epigastric pain: Secondary | ICD-10-CM | POA: Diagnosis not present

## 2014-05-03 ENCOUNTER — Other Ambulatory Visit: Payer: Self-pay

## 2014-05-03 DIAGNOSIS — Z1231 Encounter for screening mammogram for malignant neoplasm of breast: Secondary | ICD-10-CM

## 2014-05-09 DIAGNOSIS — D1801 Hemangioma of skin and subcutaneous tissue: Secondary | ICD-10-CM | POA: Diagnosis not present

## 2014-05-09 DIAGNOSIS — L821 Other seborrheic keratosis: Secondary | ICD-10-CM | POA: Diagnosis not present

## 2014-05-09 DIAGNOSIS — L738 Other specified follicular disorders: Secondary | ICD-10-CM | POA: Diagnosis not present

## 2014-05-09 DIAGNOSIS — L918 Other hypertrophic disorders of the skin: Secondary | ICD-10-CM | POA: Diagnosis not present

## 2014-05-09 DIAGNOSIS — D485 Neoplasm of uncertain behavior of skin: Secondary | ICD-10-CM | POA: Diagnosis not present

## 2014-05-14 ENCOUNTER — Encounter: Payer: Self-pay | Admitting: Obstetrics and Gynecology

## 2014-05-15 DIAGNOSIS — I10 Essential (primary) hypertension: Secondary | ICD-10-CM | POA: Diagnosis not present

## 2014-05-15 DIAGNOSIS — L719 Rosacea, unspecified: Secondary | ICD-10-CM | POA: Diagnosis not present

## 2014-05-15 DIAGNOSIS — Z1389 Encounter for screening for other disorder: Secondary | ICD-10-CM | POA: Diagnosis not present

## 2014-05-15 DIAGNOSIS — E559 Vitamin D deficiency, unspecified: Secondary | ICD-10-CM | POA: Diagnosis not present

## 2014-05-15 DIAGNOSIS — I679 Cerebrovascular disease, unspecified: Secondary | ICD-10-CM | POA: Diagnosis not present

## 2014-05-15 DIAGNOSIS — J309 Allergic rhinitis, unspecified: Secondary | ICD-10-CM | POA: Diagnosis not present

## 2014-05-15 DIAGNOSIS — E782 Mixed hyperlipidemia: Secondary | ICD-10-CM | POA: Diagnosis not present

## 2014-05-15 DIAGNOSIS — Z23 Encounter for immunization: Secondary | ICD-10-CM | POA: Diagnosis not present

## 2014-05-15 DIAGNOSIS — Z0001 Encounter for general adult medical examination with abnormal findings: Secondary | ICD-10-CM | POA: Diagnosis not present

## 2014-05-18 ENCOUNTER — Ambulatory Visit
Admission: RE | Admit: 2014-05-18 | Discharge: 2014-05-18 | Disposition: A | Discharge status: Home / Self Care | Payer: Medicare Other | Source: Ambulatory Visit

## 2014-05-18 DIAGNOSIS — Z1231 Encounter for screening mammogram for malignant neoplasm of breast: Secondary | ICD-10-CM

## 2014-05-31 DIAGNOSIS — Z961 Presence of intraocular lens: Secondary | ICD-10-CM | POA: Diagnosis not present

## 2014-11-27 DIAGNOSIS — R5383 Other fatigue: Secondary | ICD-10-CM | POA: Diagnosis not present

## 2014-11-27 DIAGNOSIS — E559 Vitamin D deficiency, unspecified: Secondary | ICD-10-CM | POA: Diagnosis not present

## 2014-11-27 DIAGNOSIS — K580 Irritable bowel syndrome with diarrhea: Secondary | ICD-10-CM | POA: Diagnosis not present

## 2014-11-27 DIAGNOSIS — I10 Essential (primary) hypertension: Secondary | ICD-10-CM | POA: Diagnosis not present

## 2014-11-27 DIAGNOSIS — E782 Mixed hyperlipidemia: Secondary | ICD-10-CM | POA: Diagnosis not present

## 2014-11-27 DIAGNOSIS — I679 Cerebrovascular disease, unspecified: Secondary | ICD-10-CM | POA: Diagnosis not present

## 2014-11-27 DIAGNOSIS — J309 Allergic rhinitis, unspecified: Secondary | ICD-10-CM | POA: Diagnosis not present

## 2014-11-27 DIAGNOSIS — L719 Rosacea, unspecified: Secondary | ICD-10-CM | POA: Diagnosis not present

## 2015-03-22 DIAGNOSIS — I10 Essential (primary) hypertension: Secondary | ICD-10-CM | POA: Diagnosis not present

## 2015-05-07 DIAGNOSIS — Z23 Encounter for immunization: Secondary | ICD-10-CM | POA: Diagnosis not present

## 2015-06-04 DIAGNOSIS — I679 Cerebrovascular disease, unspecified: Secondary | ICD-10-CM | POA: Diagnosis not present

## 2015-06-04 DIAGNOSIS — Z79899 Other long term (current) drug therapy: Secondary | ICD-10-CM | POA: Diagnosis not present

## 2015-06-04 DIAGNOSIS — Z0001 Encounter for general adult medical examination with abnormal findings: Secondary | ICD-10-CM | POA: Diagnosis not present

## 2015-06-04 DIAGNOSIS — R5383 Other fatigue: Secondary | ICD-10-CM | POA: Diagnosis not present

## 2015-06-04 DIAGNOSIS — E782 Mixed hyperlipidemia: Secondary | ICD-10-CM | POA: Diagnosis not present

## 2015-06-04 DIAGNOSIS — J309 Allergic rhinitis, unspecified: Secondary | ICD-10-CM | POA: Diagnosis not present

## 2015-06-04 DIAGNOSIS — K580 Irritable bowel syndrome with diarrhea: Secondary | ICD-10-CM | POA: Diagnosis not present

## 2015-06-04 DIAGNOSIS — I10 Essential (primary) hypertension: Secondary | ICD-10-CM | POA: Diagnosis not present

## 2015-06-04 DIAGNOSIS — E559 Vitamin D deficiency, unspecified: Secondary | ICD-10-CM | POA: Diagnosis not present

## 2015-06-04 DIAGNOSIS — L719 Rosacea, unspecified: Secondary | ICD-10-CM | POA: Diagnosis not present

## 2015-06-04 DIAGNOSIS — Z1389 Encounter for screening for other disorder: Secondary | ICD-10-CM | POA: Diagnosis not present

## 2015-07-18 DIAGNOSIS — L821 Other seborrheic keratosis: Secondary | ICD-10-CM | POA: Diagnosis not present

## 2015-07-18 DIAGNOSIS — D2271 Melanocytic nevi of right lower limb, including hip: Secondary | ICD-10-CM | POA: Diagnosis not present

## 2015-07-18 DIAGNOSIS — D2272 Melanocytic nevi of left lower limb, including hip: Secondary | ICD-10-CM | POA: Diagnosis not present

## 2015-07-18 DIAGNOSIS — D2261 Melanocytic nevi of right upper limb, including shoulder: Secondary | ICD-10-CM | POA: Diagnosis not present

## 2015-07-18 DIAGNOSIS — D2239 Melanocytic nevi of other parts of face: Secondary | ICD-10-CM | POA: Diagnosis not present

## 2015-07-18 DIAGNOSIS — L814 Other melanin hyperpigmentation: Secondary | ICD-10-CM | POA: Diagnosis not present

## 2015-07-18 DIAGNOSIS — D1801 Hemangioma of skin and subcutaneous tissue: Secondary | ICD-10-CM | POA: Diagnosis not present

## 2015-07-18 DIAGNOSIS — L918 Other hypertrophic disorders of the skin: Secondary | ICD-10-CM | POA: Diagnosis not present

## 2015-07-18 DIAGNOSIS — D2262 Melanocytic nevi of left upper limb, including shoulder: Secondary | ICD-10-CM | POA: Diagnosis not present

## 2015-08-19 DIAGNOSIS — Z961 Presence of intraocular lens: Secondary | ICD-10-CM | POA: Diagnosis not present

## 2015-10-22 DIAGNOSIS — Z86010 Personal history of colonic polyps: Secondary | ICD-10-CM | POA: Diagnosis not present

## 2015-10-22 DIAGNOSIS — R197 Diarrhea, unspecified: Secondary | ICD-10-CM | POA: Diagnosis not present

## 2015-10-22 DIAGNOSIS — R1013 Epigastric pain: Secondary | ICD-10-CM | POA: Diagnosis not present

## 2015-10-22 DIAGNOSIS — R1030 Lower abdominal pain, unspecified: Secondary | ICD-10-CM | POA: Diagnosis not present

## 2015-11-11 DIAGNOSIS — K648 Other hemorrhoids: Secondary | ICD-10-CM | POA: Diagnosis not present

## 2015-11-11 DIAGNOSIS — R1013 Epigastric pain: Secondary | ICD-10-CM | POA: Diagnosis not present

## 2015-11-11 DIAGNOSIS — K5730 Diverticulosis of large intestine without perforation or abscess without bleeding: Secondary | ICD-10-CM | POA: Diagnosis not present

## 2015-11-11 DIAGNOSIS — K529 Noninfective gastroenteritis and colitis, unspecified: Secondary | ICD-10-CM | POA: Diagnosis not present

## 2015-11-11 DIAGNOSIS — Z86010 Personal history of colonic polyps: Secondary | ICD-10-CM | POA: Diagnosis not present

## 2015-11-11 DIAGNOSIS — K2970 Gastritis, unspecified, without bleeding: Secondary | ICD-10-CM | POA: Diagnosis not present

## 2015-11-11 DIAGNOSIS — R197 Diarrhea, unspecified: Secondary | ICD-10-CM | POA: Diagnosis not present

## 2015-12-16 DIAGNOSIS — R5383 Other fatigue: Secondary | ICD-10-CM | POA: Diagnosis not present

## 2015-12-16 DIAGNOSIS — Z79899 Other long term (current) drug therapy: Secondary | ICD-10-CM | POA: Diagnosis not present

## 2015-12-16 DIAGNOSIS — K529 Noninfective gastroenteritis and colitis, unspecified: Secondary | ICD-10-CM | POA: Diagnosis not present

## 2015-12-16 DIAGNOSIS — I679 Cerebrovascular disease, unspecified: Secondary | ICD-10-CM | POA: Diagnosis not present

## 2015-12-16 DIAGNOSIS — E559 Vitamin D deficiency, unspecified: Secondary | ICD-10-CM | POA: Diagnosis not present

## 2015-12-16 DIAGNOSIS — K580 Irritable bowel syndrome with diarrhea: Secondary | ICD-10-CM | POA: Diagnosis not present

## 2015-12-16 DIAGNOSIS — L719 Rosacea, unspecified: Secondary | ICD-10-CM | POA: Diagnosis not present

## 2015-12-16 DIAGNOSIS — I10 Essential (primary) hypertension: Secondary | ICD-10-CM | POA: Diagnosis not present

## 2015-12-16 DIAGNOSIS — J309 Allergic rhinitis, unspecified: Secondary | ICD-10-CM | POA: Diagnosis not present

## 2015-12-16 DIAGNOSIS — E782 Mixed hyperlipidemia: Secondary | ICD-10-CM | POA: Diagnosis not present

## 2015-12-16 DIAGNOSIS — R197 Diarrhea, unspecified: Secondary | ICD-10-CM | POA: Diagnosis not present

## 2016-01-06 DIAGNOSIS — R1013 Epigastric pain: Secondary | ICD-10-CM | POA: Diagnosis not present

## 2016-01-06 DIAGNOSIS — Z86010 Personal history of colonic polyps: Secondary | ICD-10-CM | POA: Diagnosis not present

## 2016-02-11 ENCOUNTER — Other Ambulatory Visit: Payer: Self-pay | Admitting: Internal Medicine

## 2016-02-11 DIAGNOSIS — Z1231 Encounter for screening mammogram for malignant neoplasm of breast: Secondary | ICD-10-CM

## 2016-03-13 ENCOUNTER — Ambulatory Visit: Payer: BLUE CROSS/BLUE SHIELD

## 2016-05-15 DIAGNOSIS — Z23 Encounter for immunization: Secondary | ICD-10-CM | POA: Diagnosis not present

## 2016-06-24 DIAGNOSIS — R5383 Other fatigue: Secondary | ICD-10-CM | POA: Diagnosis not present

## 2016-06-24 DIAGNOSIS — Z6825 Body mass index (BMI) 25.0-25.9, adult: Secondary | ICD-10-CM | POA: Diagnosis not present

## 2016-06-24 DIAGNOSIS — E669 Obesity, unspecified: Secondary | ICD-10-CM | POA: Diagnosis not present

## 2016-06-24 DIAGNOSIS — I10 Essential (primary) hypertension: Secondary | ICD-10-CM | POA: Diagnosis not present

## 2016-06-24 DIAGNOSIS — K580 Irritable bowel syndrome with diarrhea: Secondary | ICD-10-CM | POA: Diagnosis not present

## 2016-06-24 DIAGNOSIS — J309 Allergic rhinitis, unspecified: Secondary | ICD-10-CM | POA: Diagnosis not present

## 2016-06-24 DIAGNOSIS — I679 Cerebrovascular disease, unspecified: Secondary | ICD-10-CM | POA: Diagnosis not present

## 2016-06-24 DIAGNOSIS — E559 Vitamin D deficiency, unspecified: Secondary | ICD-10-CM | POA: Diagnosis not present

## 2016-06-24 DIAGNOSIS — Z79899 Other long term (current) drug therapy: Secondary | ICD-10-CM | POA: Diagnosis not present

## 2016-06-24 DIAGNOSIS — Z0001 Encounter for general adult medical examination with abnormal findings: Secondary | ICD-10-CM | POA: Diagnosis not present

## 2016-06-24 DIAGNOSIS — E782 Mixed hyperlipidemia: Secondary | ICD-10-CM | POA: Diagnosis not present

## 2016-06-24 DIAGNOSIS — Z1389 Encounter for screening for other disorder: Secondary | ICD-10-CM | POA: Diagnosis not present

## 2016-06-24 DIAGNOSIS — L719 Rosacea, unspecified: Secondary | ICD-10-CM | POA: Diagnosis not present

## 2016-07-23 DIAGNOSIS — R1013 Epigastric pain: Secondary | ICD-10-CM | POA: Diagnosis not present

## 2016-07-23 DIAGNOSIS — Z86010 Personal history of colonic polyps: Secondary | ICD-10-CM | POA: Diagnosis not present

## 2016-08-17 DIAGNOSIS — Z961 Presence of intraocular lens: Secondary | ICD-10-CM | POA: Diagnosis not present

## 2016-08-17 DIAGNOSIS — H524 Presbyopia: Secondary | ICD-10-CM | POA: Diagnosis not present

## 2016-08-17 DIAGNOSIS — H52203 Unspecified astigmatism, bilateral: Secondary | ICD-10-CM | POA: Diagnosis not present

## 2016-08-18 ENCOUNTER — Other Ambulatory Visit: Payer: Self-pay | Admitting: Internal Medicine

## 2016-08-18 DIAGNOSIS — R109 Unspecified abdominal pain: Secondary | ICD-10-CM | POA: Diagnosis not present

## 2016-08-18 DIAGNOSIS — I10 Essential (primary) hypertension: Secondary | ICD-10-CM | POA: Diagnosis not present

## 2016-08-18 DIAGNOSIS — R1031 Right lower quadrant pain: Secondary | ICD-10-CM | POA: Diagnosis not present

## 2016-08-18 DIAGNOSIS — R102 Pelvic and perineal pain: Secondary | ICD-10-CM

## 2016-08-26 ENCOUNTER — Ambulatory Visit
Admission: RE | Admit: 2016-08-26 | Discharge: 2016-08-26 | Disposition: A | Discharge status: Home / Self Care | Payer: Medicare Other | Source: Ambulatory Visit | Attending: Internal Medicine | Admitting: Internal Medicine

## 2016-08-26 DIAGNOSIS — R109 Unspecified abdominal pain: Secondary | ICD-10-CM

## 2016-08-26 DIAGNOSIS — R1031 Right lower quadrant pain: Secondary | ICD-10-CM | POA: Diagnosis not present

## 2016-08-26 DIAGNOSIS — R102 Pelvic and perineal pain: Secondary | ICD-10-CM

## 2016-08-27 DIAGNOSIS — R109 Unspecified abdominal pain: Secondary | ICD-10-CM | POA: Diagnosis not present

## 2016-08-27 DIAGNOSIS — I10 Essential (primary) hypertension: Secondary | ICD-10-CM | POA: Diagnosis not present

## 2016-08-27 DIAGNOSIS — R1031 Right lower quadrant pain: Secondary | ICD-10-CM | POA: Diagnosis not present

## 2016-09-17 ENCOUNTER — Ambulatory Visit: Payer: BLUE CROSS/BLUE SHIELD

## 2016-09-22 ENCOUNTER — Other Ambulatory Visit: Payer: Self-pay | Admitting: Internal Medicine

## 2016-09-22 DIAGNOSIS — R109 Unspecified abdominal pain: Secondary | ICD-10-CM

## 2016-09-24 ENCOUNTER — Ambulatory Visit
Admission: RE | Admit: 2016-09-24 | Discharge: 2016-09-24 | Disposition: A | Discharge status: Home / Self Care | Payer: Medicare Other | Source: Ambulatory Visit | Attending: Internal Medicine | Admitting: Internal Medicine

## 2016-09-24 DIAGNOSIS — R1031 Right lower quadrant pain: Secondary | ICD-10-CM | POA: Diagnosis not present

## 2016-09-24 DIAGNOSIS — R109 Unspecified abdominal pain: Secondary | ICD-10-CM

## 2016-09-24 MED ORDER — IOPAMIDOL (ISOVUE-300) INJECTION 61%
100.0000 mL | Freq: Once | INTRAVENOUS | Status: AC | PRN
  Administered 2016-09-24: 100 mL via INTRAVENOUS

## 2016-10-07 ENCOUNTER — Ambulatory Visit
Admission: RE | Admit: 2016-10-07 | Discharge: 2016-10-07 | Disposition: A | Discharge status: Home / Self Care | Payer: Medicare Other | Source: Ambulatory Visit | Attending: Internal Medicine | Admitting: Internal Medicine

## 2016-10-07 DIAGNOSIS — D2262 Melanocytic nevi of left upper limb, including shoulder: Secondary | ICD-10-CM | POA: Diagnosis not present

## 2016-10-07 DIAGNOSIS — D2261 Melanocytic nevi of right upper limb, including shoulder: Secondary | ICD-10-CM | POA: Diagnosis not present

## 2016-10-07 DIAGNOSIS — D2239 Melanocytic nevi of other parts of face: Secondary | ICD-10-CM | POA: Diagnosis not present

## 2016-10-07 DIAGNOSIS — L738 Other specified follicular disorders: Secondary | ICD-10-CM | POA: Diagnosis not present

## 2016-10-07 DIAGNOSIS — L918 Other hypertrophic disorders of the skin: Secondary | ICD-10-CM | POA: Diagnosis not present

## 2016-10-07 DIAGNOSIS — L821 Other seborrheic keratosis: Secondary | ICD-10-CM | POA: Diagnosis not present

## 2016-10-07 DIAGNOSIS — D2271 Melanocytic nevi of right lower limb, including hip: Secondary | ICD-10-CM | POA: Diagnosis not present

## 2016-10-07 DIAGNOSIS — Z1231 Encounter for screening mammogram for malignant neoplasm of breast: Secondary | ICD-10-CM | POA: Diagnosis not present

## 2016-10-07 DIAGNOSIS — D1801 Hemangioma of skin and subcutaneous tissue: Secondary | ICD-10-CM | POA: Diagnosis not present

## 2016-10-07 DIAGNOSIS — L814 Other melanin hyperpigmentation: Secondary | ICD-10-CM | POA: Diagnosis not present

## 2016-12-31 DIAGNOSIS — E782 Mixed hyperlipidemia: Secondary | ICD-10-CM | POA: Diagnosis not present

## 2016-12-31 DIAGNOSIS — E559 Vitamin D deficiency, unspecified: Secondary | ICD-10-CM | POA: Diagnosis not present

## 2016-12-31 DIAGNOSIS — I10 Essential (primary) hypertension: Secondary | ICD-10-CM | POA: Diagnosis not present

## 2016-12-31 DIAGNOSIS — I700 Atherosclerosis of aorta: Secondary | ICD-10-CM | POA: Diagnosis not present

## 2016-12-31 DIAGNOSIS — K580 Irritable bowel syndrome with diarrhea: Secondary | ICD-10-CM | POA: Diagnosis not present

## 2016-12-31 DIAGNOSIS — R5383 Other fatigue: Secondary | ICD-10-CM | POA: Diagnosis not present

## 2016-12-31 DIAGNOSIS — R7303 Prediabetes: Secondary | ICD-10-CM | POA: Diagnosis not present

## 2016-12-31 DIAGNOSIS — I679 Cerebrovascular disease, unspecified: Secondary | ICD-10-CM | POA: Diagnosis not present

## 2016-12-31 DIAGNOSIS — J309 Allergic rhinitis, unspecified: Secondary | ICD-10-CM | POA: Diagnosis not present

## 2016-12-31 DIAGNOSIS — L719 Rosacea, unspecified: Secondary | ICD-10-CM | POA: Diagnosis not present

## 2017-04-21 DIAGNOSIS — Z23 Encounter for immunization: Secondary | ICD-10-CM | POA: Diagnosis not present

## 2017-05-14 DIAGNOSIS — L600 Ingrowing nail: Secondary | ICD-10-CM | POA: Diagnosis not present

## 2017-05-14 DIAGNOSIS — L918 Other hypertrophic disorders of the skin: Secondary | ICD-10-CM | POA: Diagnosis not present

## 2017-06-29 DIAGNOSIS — Z0000 Encounter for general adult medical examination without abnormal findings: Secondary | ICD-10-CM | POA: Diagnosis not present

## 2017-06-29 DIAGNOSIS — E559 Vitamin D deficiency, unspecified: Secondary | ICD-10-CM | POA: Diagnosis not present

## 2017-06-29 DIAGNOSIS — K580 Irritable bowel syndrome with diarrhea: Secondary | ICD-10-CM | POA: Diagnosis not present

## 2017-06-29 DIAGNOSIS — I10 Essential (primary) hypertension: Secondary | ICD-10-CM | POA: Diagnosis not present

## 2017-06-29 DIAGNOSIS — I700 Atherosclerosis of aorta: Secondary | ICD-10-CM | POA: Diagnosis not present

## 2017-06-29 DIAGNOSIS — L719 Rosacea, unspecified: Secondary | ICD-10-CM | POA: Diagnosis not present

## 2017-06-29 DIAGNOSIS — E782 Mixed hyperlipidemia: Secondary | ICD-10-CM | POA: Diagnosis not present

## 2017-06-29 DIAGNOSIS — J309 Allergic rhinitis, unspecified: Secondary | ICD-10-CM | POA: Diagnosis not present

## 2017-06-29 DIAGNOSIS — R7303 Prediabetes: Secondary | ICD-10-CM | POA: Diagnosis not present

## 2017-06-29 DIAGNOSIS — I679 Cerebrovascular disease, unspecified: Secondary | ICD-10-CM | POA: Diagnosis not present

## 2017-06-29 DIAGNOSIS — Z1389 Encounter for screening for other disorder: Secondary | ICD-10-CM | POA: Diagnosis not present

## 2017-06-29 DIAGNOSIS — Z79899 Other long term (current) drug therapy: Secondary | ICD-10-CM | POA: Diagnosis not present

## 2017-07-09 DIAGNOSIS — E781 Pure hyperglyceridemia: Secondary | ICD-10-CM | POA: Diagnosis not present

## 2017-07-09 DIAGNOSIS — I10 Essential (primary) hypertension: Secondary | ICD-10-CM | POA: Diagnosis not present

## 2017-07-09 DIAGNOSIS — E782 Mixed hyperlipidemia: Secondary | ICD-10-CM | POA: Diagnosis not present

## 2017-08-10 DIAGNOSIS — Z79899 Other long term (current) drug therapy: Secondary | ICD-10-CM | POA: Diagnosis not present

## 2017-08-10 DIAGNOSIS — E782 Mixed hyperlipidemia: Secondary | ICD-10-CM | POA: Diagnosis not present

## 2017-08-10 DIAGNOSIS — E559 Vitamin D deficiency, unspecified: Secondary | ICD-10-CM | POA: Diagnosis not present

## 2017-08-10 DIAGNOSIS — R7303 Prediabetes: Secondary | ICD-10-CM | POA: Diagnosis not present

## 2017-08-10 DIAGNOSIS — Z0001 Encounter for general adult medical examination with abnormal findings: Secondary | ICD-10-CM | POA: Diagnosis not present

## 2017-08-10 DIAGNOSIS — I10 Essential (primary) hypertension: Secondary | ICD-10-CM | POA: Diagnosis not present

## 2017-08-11 DIAGNOSIS — M6530 Trigger finger, unspecified finger: Secondary | ICD-10-CM | POA: Diagnosis not present

## 2017-08-23 DIAGNOSIS — Z961 Presence of intraocular lens: Secondary | ICD-10-CM | POA: Diagnosis not present

## 2017-09-17 DIAGNOSIS — J209 Acute bronchitis, unspecified: Secondary | ICD-10-CM | POA: Diagnosis not present

## 2017-09-29 DIAGNOSIS — M65331 Trigger finger, right middle finger: Secondary | ICD-10-CM | POA: Diagnosis not present

## 2017-10-04 ENCOUNTER — Other Ambulatory Visit: Payer: Self-pay | Admitting: Internal Medicine

## 2017-10-04 DIAGNOSIS — Z1231 Encounter for screening mammogram for malignant neoplasm of breast: Secondary | ICD-10-CM

## 2017-10-25 ENCOUNTER — Ambulatory Visit
Admission: RE | Admit: 2017-10-25 | Discharge: 2017-10-25 | Disposition: A | Discharge status: Home / Self Care | Payer: Medicare Other | Source: Ambulatory Visit | Attending: Internal Medicine | Admitting: Internal Medicine

## 2017-10-25 DIAGNOSIS — Z1231 Encounter for screening mammogram for malignant neoplasm of breast: Secondary | ICD-10-CM

## 2017-10-26 ENCOUNTER — Other Ambulatory Visit: Payer: Self-pay | Admitting: Internal Medicine

## 2017-10-26 DIAGNOSIS — N63 Unspecified lump in unspecified breast: Secondary | ICD-10-CM

## 2017-11-05 ENCOUNTER — Ambulatory Visit
Admission: RE | Admit: 2017-11-05 | Discharge: 2017-11-05 | Disposition: A | Discharge status: Home / Self Care | Payer: Medicare Other | Source: Ambulatory Visit | Attending: Internal Medicine | Admitting: Internal Medicine

## 2017-11-05 DIAGNOSIS — N63 Unspecified lump in unspecified breast: Secondary | ICD-10-CM

## 2017-11-05 DIAGNOSIS — N644 Mastodynia: Secondary | ICD-10-CM | POA: Diagnosis not present

## 2017-11-05 DIAGNOSIS — R928 Other abnormal and inconclusive findings on diagnostic imaging of breast: Secondary | ICD-10-CM | POA: Diagnosis not present

## 2017-11-19 DIAGNOSIS — L820 Inflamed seborrheic keratosis: Secondary | ICD-10-CM | POA: Diagnosis not present

## 2017-11-19 DIAGNOSIS — D1801 Hemangioma of skin and subcutaneous tissue: Secondary | ICD-10-CM | POA: Diagnosis not present

## 2017-11-19 DIAGNOSIS — D2261 Melanocytic nevi of right upper limb, including shoulder: Secondary | ICD-10-CM | POA: Diagnosis not present

## 2017-11-19 DIAGNOSIS — L821 Other seborrheic keratosis: Secondary | ICD-10-CM | POA: Diagnosis not present

## 2017-11-19 DIAGNOSIS — D2262 Melanocytic nevi of left upper limb, including shoulder: Secondary | ICD-10-CM | POA: Diagnosis not present

## 2017-11-19 DIAGNOSIS — D2272 Melanocytic nevi of left lower limb, including hip: Secondary | ICD-10-CM | POA: Diagnosis not present

## 2017-11-19 DIAGNOSIS — D2271 Melanocytic nevi of right lower limb, including hip: Secondary | ICD-10-CM | POA: Diagnosis not present

## 2017-11-19 DIAGNOSIS — L300 Nummular dermatitis: Secondary | ICD-10-CM | POA: Diagnosis not present

## 2017-11-19 DIAGNOSIS — L738 Other specified follicular disorders: Secondary | ICD-10-CM | POA: Diagnosis not present

## 2017-12-01 DIAGNOSIS — M65331 Trigger finger, right middle finger: Secondary | ICD-10-CM | POA: Diagnosis not present

## 2017-12-03 DIAGNOSIS — I10 Essential (primary) hypertension: Secondary | ICD-10-CM | POA: Diagnosis not present

## 2017-12-03 DIAGNOSIS — E782 Mixed hyperlipidemia: Secondary | ICD-10-CM | POA: Diagnosis not present

## 2018-02-02 ENCOUNTER — Ambulatory Visit (INDEPENDENT_AMBULATORY_CARE_PROVIDER_SITE_OTHER): Payer: Medicare Other | Admitting: Podiatry

## 2018-02-02 ENCOUNTER — Encounter: Payer: Self-pay | Admitting: Podiatry

## 2018-02-02 ENCOUNTER — Other Ambulatory Visit: Payer: Self-pay

## 2018-02-02 VITALS — BP 105/62 | HR 75

## 2018-02-02 DIAGNOSIS — M21619 Bunion of unspecified foot: Secondary | ICD-10-CM

## 2018-02-02 DIAGNOSIS — L84 Corns and callosities: Secondary | ICD-10-CM | POA: Diagnosis not present

## 2018-02-02 DIAGNOSIS — L600 Ingrowing nail: Secondary | ICD-10-CM

## 2018-02-02 NOTE — Patient Instructions (Signed)

## 2018-02-02 NOTE — Progress Notes (Signed)
Subjective:   Patient ID: Brianna Snyder, female   DOB: 72 y.o.   MRN: [id number]   HPI Patient presents complaining of a chronic ingrown toenail of her right big toe that she had worked on once before by dermatologist without success and a painful lesion underneath her right foot and history of bunion deformity bilateral this only mildly symptomatic.  Patient does not smoke and likes to be active   Review of Systems  All other systems reviewed and are negative.       Objective:  Physical Exam  Constitutional: She appears well-developed and well-nourished.  Cardiovascular: Intact distal pulses.  Pulmonary/Chest: Effort normal.  Musculoskeletal: Normal range of motion.  Neurological: She is alert.  Skin: Skin is warm.  Nursing note and vitals reviewed.   Neurovascular status found to be intact muscle strength is adequate range of motion within normal limits with patient found to have incurvated right hallux medial border that is very painful when pressed with slight redness around the edge but no drainage noted.  Patient is found to have keratotic lesion subsecond metatarsal right localized in nature and is noted to have moderate structural bunion deformity bilateral.  Patient has good digital perfusion well oriented x3     Assessment:  Ingrown toenail deformity chronic in nature right hallux medial border with pain and corn callus formation right with structural bunion deformity of a mild nature bilateral     Plan:  H&P all conditions reviewed and today I debrided the lesion and discussed correction of the ingrown toenail explained procedure to patient and patient reviewed consent form signed consent form understanding risk.  I infiltrated the right hallux 60 mg Xylocaine Marcaine mixture sterile prep applied to the toe and using sterile instrumentation I remove the medial border exposed matrix and applied phenol 3 applications 30 seconds followed by alcohol lavage and sterile  dressing.  Gave instructions on soaks and reappoint

## 2018-02-24 ENCOUNTER — Encounter: Payer: Self-pay | Admitting: Podiatry

## 2018-02-24 ENCOUNTER — Other Ambulatory Visit: Payer: Self-pay | Admitting: Podiatry

## 2018-02-24 ENCOUNTER — Ambulatory Visit (INDEPENDENT_AMBULATORY_CARE_PROVIDER_SITE_OTHER): Payer: Medicare Other

## 2018-02-24 ENCOUNTER — Ambulatory Visit (INDEPENDENT_AMBULATORY_CARE_PROVIDER_SITE_OTHER): Payer: Medicare Other | Admitting: Podiatry

## 2018-02-24 DIAGNOSIS — M779 Enthesopathy, unspecified: Secondary | ICD-10-CM

## 2018-02-24 DIAGNOSIS — M79671 Pain in right foot: Secondary | ICD-10-CM

## 2018-02-24 MED ORDER — TRIAMCINOLONE ACETONIDE 10 MG/ML IJ SUSP
10.0000 mg | Freq: Once | INTRAMUSCULAR | Status: AC
  Administered 2018-02-24: 10 mg

## 2018-02-24 NOTE — Progress Notes (Signed)
Subjective:   Patient ID: Brianna Snyder, female   DOB: 72 y.o.   MRN: [id number]   HPI Patient states she hurt her toe about 4 to 6 weeks ago and is been very sore around the joint surface   ROS      Objective:  Physical Exam  Neurovascular status intact with inflammation pain around the second MPJ right with fluid buildup     Assessment:  Chronic capsulitis digit to right with probable trauma as precipitating factor     Plan:  H&P x-ray reviewed today I did a proximal nerve block of the right forefoot and then using sterile instrumentation I aspirated the joint getting out a small amount of clear fluid injected with quarter cc Dexasone Kenalog and thick plantar pad to take pressure off the joint.  Reappoint to recheck his symptoms indicate  X-rays indicate there is no signs of fracture or arthritis of the joint surface

## 2018-03-29 ENCOUNTER — Ambulatory Visit (INDEPENDENT_AMBULATORY_CARE_PROVIDER_SITE_OTHER): Payer: Self-pay | Admitting: Orthotics

## 2018-03-29 DIAGNOSIS — L84 Corns and callosities: Secondary | ICD-10-CM

## 2018-03-29 DIAGNOSIS — M79671 Pain in right foot: Secondary | ICD-10-CM

## 2018-03-29 NOTE — Progress Notes (Signed)
Patient came into today to be cast for Custom Foot Orthotics. Upon recommendation of Dr. Paulla Dolly Patient presents with capsulitis R (2nd) Goals are forefoot cushioning/offloading with soft accommodative device.  Plan vendor Richie

## 2018-04-19 ENCOUNTER — Encounter: Payer: Medicare Other | Admitting: Orthotics

## 2018-04-20 ENCOUNTER — Ambulatory Visit (INDEPENDENT_AMBULATORY_CARE_PROVIDER_SITE_OTHER): Payer: Medicare Other | Admitting: Orthotics

## 2018-04-20 DIAGNOSIS — M779 Enthesopathy, unspecified: Secondary | ICD-10-CM

## 2018-04-20 DIAGNOSIS — L84 Corns and callosities: Secondary | ICD-10-CM

## 2018-04-20 DIAGNOSIS — Z23 Encounter for immunization: Secondary | ICD-10-CM | POA: Diagnosis not present

## 2018-04-20 NOTE — Progress Notes (Signed)
Patient came in today to pick up custom made foot orthotics.  The goals were accomplished and the patient reported no dissatisfaction with said orthotics.  Patient was advised of breakin period and how to report any issues. 

## 2018-04-21 ENCOUNTER — Encounter: Payer: Medicare Other | Admitting: Orthotics

## 2018-06-22 ENCOUNTER — Encounter: Payer: Self-pay | Admitting: Podiatry

## 2018-06-22 ENCOUNTER — Ambulatory Visit (INDEPENDENT_AMBULATORY_CARE_PROVIDER_SITE_OTHER): Payer: Medicare Other | Admitting: Podiatry

## 2018-06-22 DIAGNOSIS — M779 Enthesopathy, unspecified: Secondary | ICD-10-CM | POA: Diagnosis not present

## 2018-06-22 NOTE — Progress Notes (Signed)
Subjective:   Patient ID: Brianna Snyder, female   DOB: 72 y.o.   MRN: [id number]   HPI Patient presents with a lot of discomfort plantar aspect right second joint stating that she had relief for period of time" better but still very tender   ROS      Objective:  Physical Exam  Neurovascular status intact with inflamed capsule second MPJ right that responded only partially to injection treatment and shoe gear modification     Assessment:  Acute capsulitis second MPJ right     Plan:  At this point went ahead and immobilized with boot to try to take all pressure off the foot and to reduce the stress against the capsule.  Patient had air fracture walker dispensed with all instructions on usage and will be seen back in 6 weeks and may ultimately require surgery for this deformity

## 2018-07-27 ENCOUNTER — Ambulatory Visit: Payer: Medicare Other | Admitting: Podiatry

## 2018-08-03 ENCOUNTER — Ambulatory Visit (INDEPENDENT_AMBULATORY_CARE_PROVIDER_SITE_OTHER): Payer: Medicare Other

## 2018-08-03 ENCOUNTER — Encounter: Payer: Self-pay | Admitting: Podiatry

## 2018-08-03 ENCOUNTER — Other Ambulatory Visit: Payer: Self-pay | Admitting: Podiatry

## 2018-08-03 ENCOUNTER — Ambulatory Visit (INDEPENDENT_AMBULATORY_CARE_PROVIDER_SITE_OTHER): Payer: Medicare Other | Admitting: Podiatry

## 2018-08-03 DIAGNOSIS — M779 Enthesopathy, unspecified: Secondary | ICD-10-CM

## 2018-08-03 DIAGNOSIS — M79671 Pain in right foot: Secondary | ICD-10-CM | POA: Diagnosis not present

## 2018-08-03 DIAGNOSIS — M21619 Bunion of unspecified foot: Secondary | ICD-10-CM

## 2018-08-03 NOTE — Patient Instructions (Signed)
Pre-Operative Instructions  Congratulations, you have decided to take an important step towards improving your quality of life.  You can be assured that the doctors and staff at [facility] will be with you every step of the way.  Here are some important things you should know:  1. Plan to be at the surgery center/hospital at least 1 (one) hour prior to your scheduled time, unless otherwise directed by the surgical center/hospital staff.  You must have a responsible adult accompany you, remain during the surgery and drive you home.  Make sure you have directions to the surgical center/hospital to ensure you arrive on time. 2. If you are having surgery at [facility] or [facility], you will need a copy of your medical history and physical form from your family physician within one month prior to the date of surgery. We will give you a form for your primary physician to complete.  3. We make every effort to accommodate the date you request for surgery.  However, there are times where surgery dates or times have to be moved.  We will contact you as soon as possible if a change in schedule is required.   4. No aspirin/ibuprofen for one week before surgery.  If you are on aspirin, any non-steroidal anti-inflammatory medications (Mobic, Aleve, Ibuprofen) should not be taken seven (7) days prior to your surgery.  You make take Tylenol for pain prior to surgery.  5. Medications - If you are taking daily heart and blood pressure medications, seizure, reflux, allergy, asthma, anxiety, pain or diabetes medications, make sure you notify the surgery center/hospital before the day of surgery so they can tell you which medications you should take or avoid the day of surgery. 6. No food or drink after midnight the night before surgery unless directed otherwise by surgical center/hospital staff. 7. No alcoholic beverages 24-hours prior to surgery.  No smoking 24-hours prior or 24-hours after  surgery. 8. Wear loose pants or shorts. They should be loose enough to fit over bandages, boots, and casts. 9. Don't wear slip-on shoes. Sneakers are preferred. 10. Bring your boot with you to the surgery center/hospital.  Also bring crutches or a walker if your physician has prescribed it for you.  If you do not have this equipment, it will be provided for you after surgery. 11. If you have not been contacted by the surgery center/hospital by the day before your surgery, call to confirm the date and time of your surgery. 12. Leave-time from work may vary depending on the type of surgery you have.  Appropriate arrangements should be made prior to surgery with your employer. 13. Prescriptions will be provided immediately following surgery by your doctor.  Fill these as soon as possible after surgery and take the medication as directed. Pain medications will not be refilled on weekends and must be approved by the doctor. 14. Remove nail polish on the operative foot and avoid getting pedicures prior to surgery. 15. Wash the night before surgery.  The night before surgery wash the foot and leg well with water and the antibacterial soap provided. Be sure to pay special attention to beneath the toenails and in between the toes.  Wash for at least three (3) minutes. Rinse thoroughly with water and dry well with a towel.  Perform this wash unless told not to do so by your physician.  Enclosed: 1 Ice pack (please put in freezer the night before surgery)   1 Hibiclens skin cleaner     Pre-op instructions  If you have any questions regarding the instructions, please do not hesitate to call our office.  [place]: [address], [place], [place] [address] -- [phone]  [place]: [address] [place], [place] [address] -- [phone]  [place]: [address]  [place], [place] [address] -- [phone]  [place]: [address], [place], [place] [address] -- [phone]  Website:  [email or web address] 

## 2018-08-03 NOTE — Progress Notes (Signed)
Subjective:   Patient ID: Brianna Snyder, female   DOB: 73 y.o.   MRN: [id number]   HPI Patient presents stating this area still really bothering her a lot and also my bunion bothers me on my right foot.  States that it did not respond to immobilization injection reduced activity and shoe gear modifications   ROS      Objective:  Physical Exam  Neurovascular status intact muscle strength is adequate patient found to have exquisite discomfort still noted second MPJ of the right foot with history of injury around 6 to 8 months ago.  Patient is noted to have good digital perfusion well oriented x3 and I also noted prominence of the first metatarsal head right that is red and sore and she states makes it difficult to wear shoe gear     Assessment:  Inflammatory capsulitis chronic in nature with pain does respond to conservative care second MPJ right and structural bunion deformity right with pain     Plan:  H&P and new x-ray reviewed.  Today went ahead discussed treatment options and she wants a definitive treatment and I recommended surgical intervention with shortening osteotomy along with bunion correction.  I reviewed this with her and discussed with her the procedure and risk associated with this and she is willing to accept procedure risk and wants surgery and I allowed her to read consent form going over alternative treatments complications.  Patient says no guarantee as far success and is willing to accept risk and signed consent form and today I did go ahead and I dispensed air fracture walker with all instructions on usage and she understands total recovery 6 months to 1 year with no long-term guarantees  X-ray indicates that there is structural bunion deformity significant nature right with no indication of stress fracture second metatarsal right

## 2018-08-10 ENCOUNTER — Telehealth: Payer: Self-pay | Admitting: *Deleted

## 2018-08-10 NOTE — Telephone Encounter (Signed)
I changed the date on the posting sheet.

## 2018-08-10 NOTE — Telephone Encounter (Signed)
"  I'm calling to reschedule my surgery that's scheduled for February 18.  I'd like to move it to the week of March 9."  Dr. Paulla Dolly can do it on March 10.  "The date of March 10 will be fine."  I'll get it rescheduled.  "You or someone will give me a call and give me the arrival time, correct?"  Someone from the surgical center will give you a call the Friday or Monday prior to your surgery date and they will give you your arrival time.  I changed the surgery date from 08/30/18 to 09/20/18 via [employer].

## 2018-08-29 DIAGNOSIS — Z961 Presence of intraocular lens: Secondary | ICD-10-CM | POA: Diagnosis not present

## 2018-09-14 DIAGNOSIS — E559 Vitamin D deficiency, unspecified: Secondary | ICD-10-CM | POA: Diagnosis not present

## 2018-09-14 DIAGNOSIS — E782 Mixed hyperlipidemia: Secondary | ICD-10-CM | POA: Diagnosis not present

## 2018-09-14 DIAGNOSIS — I700 Atherosclerosis of aorta: Secondary | ICD-10-CM | POA: Diagnosis not present

## 2018-09-14 DIAGNOSIS — E538 Deficiency of other specified B group vitamins: Secondary | ICD-10-CM | POA: Diagnosis not present

## 2018-09-14 DIAGNOSIS — R7309 Other abnormal glucose: Secondary | ICD-10-CM | POA: Diagnosis not present

## 2018-09-14 DIAGNOSIS — Z1389 Encounter for screening for other disorder: Secondary | ICD-10-CM | POA: Diagnosis not present

## 2018-09-14 DIAGNOSIS — I10 Essential (primary) hypertension: Secondary | ICD-10-CM | POA: Diagnosis not present

## 2018-09-14 DIAGNOSIS — S46111A Strain of muscle, fascia and tendon of long head of biceps, right arm, initial encounter: Secondary | ICD-10-CM | POA: Diagnosis not present

## 2018-09-14 DIAGNOSIS — Z0000 Encounter for general adult medical examination without abnormal findings: Secondary | ICD-10-CM | POA: Diagnosis not present

## 2018-09-20 ENCOUNTER — Encounter: Payer: Self-pay | Admitting: Podiatry

## 2018-09-20 DIAGNOSIS — M2011 Hallux valgus (acquired), right foot: Secondary | ICD-10-CM | POA: Diagnosis not present

## 2018-09-20 DIAGNOSIS — I10 Essential (primary) hypertension: Secondary | ICD-10-CM | POA: Diagnosis not present

## 2018-09-20 DIAGNOSIS — M21541 Acquired clubfoot, right foot: Secondary | ICD-10-CM | POA: Diagnosis not present

## 2018-09-20 DIAGNOSIS — M21611 Bunion of right foot: Secondary | ICD-10-CM | POA: Diagnosis not present

## 2018-09-20 DIAGNOSIS — M216X1 Other acquired deformities of right foot: Secondary | ICD-10-CM | POA: Diagnosis not present

## 2018-09-22 ENCOUNTER — Telehealth: Payer: Self-pay | Admitting: Podiatry

## 2018-09-22 NOTE — Telephone Encounter (Signed)
Patient was returning phone call 

## 2018-09-23 ENCOUNTER — Telehealth: Payer: Self-pay | Admitting: *Deleted

## 2018-09-23 NOTE — Telephone Encounter (Signed)
Called patient back and patient stated that Dr Paulla Dolly did a good job and patient was having no issues and has not even filled the pain medicine and I stated to call the office if any concerns or questions at [phone]. Lattie Haw

## 2018-09-28 ENCOUNTER — Telehealth: Payer: Self-pay | Admitting: *Deleted

## 2018-09-28 ENCOUNTER — Other Ambulatory Visit: Payer: Self-pay | Admitting: Internal Medicine

## 2018-09-28 DIAGNOSIS — Z1231 Encounter for screening mammogram for malignant neoplasm of breast: Secondary | ICD-10-CM

## 2018-09-28 NOTE — Telephone Encounter (Signed)
Called and spoke with patient and patient stated that she was doing good and there is no fever or chills and no nausea and I stated to the patient to call the [place] office [phone]. Lattie Haw

## 2018-09-29 ENCOUNTER — Other Ambulatory Visit: Payer: Self-pay

## 2018-09-29 ENCOUNTER — Ambulatory Visit (INDEPENDENT_AMBULATORY_CARE_PROVIDER_SITE_OTHER): Payer: Medicare Other | Admitting: Podiatry

## 2018-09-29 ENCOUNTER — Ambulatory Visit (INDEPENDENT_AMBULATORY_CARE_PROVIDER_SITE_OTHER): Payer: Medicare Other

## 2018-09-29 VITALS — Temp 97.5°F

## 2018-09-29 DIAGNOSIS — M779 Enthesopathy, unspecified: Secondary | ICD-10-CM | POA: Diagnosis not present

## 2018-09-29 DIAGNOSIS — Z09 Encounter for follow-up examination after completed treatment for conditions other than malignant neoplasm: Secondary | ICD-10-CM

## 2018-09-29 NOTE — Progress Notes (Signed)
Subjective:   Patient ID: Brianna Snyder, female   DOB: 73 y.o.   MRN: [id number]   HPI Patient states she is doing very well with surgery with minimal discomfort and walking comfortably in the boot   ROS      Objective:  Physical Exam  Neurovascular status intact negative Homans sign noted patient's right foot is healing well wound edges well coapted hallux in rectus position wound edges well coapted second metatarsal     Assessment:  Doing well post osteotomy right second metatarsal bunion correction right     Plan:  H&P x-ray reviewed and recommended continued elevation compression immobilization and reapplied dressing.  Reappoint 3 weeks or earlier if needed  X-ray indicates the osteotomy is healing well fixation in place with satisfactory correction of bunion

## 2018-10-27 ENCOUNTER — Ambulatory Visit (INDEPENDENT_AMBULATORY_CARE_PROVIDER_SITE_OTHER): Payer: Medicare Other

## 2018-10-27 ENCOUNTER — Encounter: Payer: Self-pay | Admitting: Podiatry

## 2018-10-27 ENCOUNTER — Other Ambulatory Visit: Payer: Self-pay

## 2018-10-27 ENCOUNTER — Ambulatory Visit (INDEPENDENT_AMBULATORY_CARE_PROVIDER_SITE_OTHER): Payer: Medicare Other | Admitting: Podiatry

## 2018-10-27 VITALS — Temp 97.7°F

## 2018-10-27 DIAGNOSIS — M779 Enthesopathy, unspecified: Secondary | ICD-10-CM

## 2018-10-27 DIAGNOSIS — M21612 Bunion of left foot: Secondary | ICD-10-CM

## 2018-10-27 DIAGNOSIS — M7751 Other enthesopathy of right foot: Secondary | ICD-10-CM | POA: Diagnosis not present

## 2018-10-31 NOTE — Progress Notes (Signed)
Subjective:   Patient ID: Brianna Snyder, female   DOB: 73 y.o.   MRN: [id number]   HPI Patient states doing very well with surgery with minimal discomfort and states that not in any pain currently and the swelling is reduced   ROS      Objective:  Physical Exam  Neurovascular status intact negative Homans sign noted wound edges well coapted good range of motion with no crepitus of the joint patient also questioning the left one stating she wants to get a simple bunion correction on the left one but does not need the other procedures     Assessment:  Doing well overall foot surgery right with deformity left     Plan:  H&P x-ray reviewed and discussed correction of the left and reviewed a McBride type bunionectomy.  She will do it later in the year and for the right when she will resume normal activity shoe gear but continued not to traumatize the foot.  Reappoint to recheck  X-rays indicate that the osteotomy is healed well fixation in place toe in good position

## 2018-11-09 ENCOUNTER — Ambulatory Visit: Payer: Medicare Other

## 2018-11-17 DIAGNOSIS — L738 Other specified follicular disorders: Secondary | ICD-10-CM | POA: Diagnosis not present

## 2018-11-17 DIAGNOSIS — D2261 Melanocytic nevi of right upper limb, including shoulder: Secondary | ICD-10-CM | POA: Diagnosis not present

## 2018-11-17 DIAGNOSIS — D2262 Melanocytic nevi of left upper limb, including shoulder: Secondary | ICD-10-CM | POA: Diagnosis not present

## 2018-11-17 DIAGNOSIS — D1801 Hemangioma of skin and subcutaneous tissue: Secondary | ICD-10-CM | POA: Diagnosis not present

## 2018-11-17 DIAGNOSIS — D485 Neoplasm of uncertain behavior of skin: Secondary | ICD-10-CM | POA: Diagnosis not present

## 2018-11-17 DIAGNOSIS — L821 Other seborrheic keratosis: Secondary | ICD-10-CM | POA: Diagnosis not present

## 2018-11-17 DIAGNOSIS — D2271 Melanocytic nevi of right lower limb, including hip: Secondary | ICD-10-CM | POA: Diagnosis not present

## 2018-12-30 ENCOUNTER — Other Ambulatory Visit: Payer: Self-pay

## 2018-12-30 ENCOUNTER — Ambulatory Visit
Admission: RE | Admit: 2018-12-30 | Discharge: 2018-12-30 | Disposition: A | Discharge status: Home / Self Care | Payer: Medicare Other | Source: Ambulatory Visit | Attending: Internal Medicine | Admitting: Internal Medicine

## 2018-12-30 DIAGNOSIS — Z1231 Encounter for screening mammogram for malignant neoplasm of breast: Secondary | ICD-10-CM | POA: Diagnosis not present

## 2019-01-04 ENCOUNTER — Other Ambulatory Visit: Payer: Self-pay | Admitting: Podiatry

## 2019-01-04 ENCOUNTER — Ambulatory Visit (INDEPENDENT_AMBULATORY_CARE_PROVIDER_SITE_OTHER): Payer: Medicare Other

## 2019-01-04 ENCOUNTER — Other Ambulatory Visit: Payer: Self-pay

## 2019-01-04 ENCOUNTER — Encounter: Payer: Self-pay | Admitting: Podiatry

## 2019-01-04 ENCOUNTER — Ambulatory Visit (INDEPENDENT_AMBULATORY_CARE_PROVIDER_SITE_OTHER): Payer: Medicare Other | Admitting: Podiatry

## 2019-01-04 VITALS — Temp 98.1°F

## 2019-01-04 DIAGNOSIS — M21619 Bunion of unspecified foot: Secondary | ICD-10-CM

## 2019-01-04 DIAGNOSIS — M21612 Bunion of left foot: Secondary | ICD-10-CM | POA: Diagnosis not present

## 2019-01-04 DIAGNOSIS — M79671 Pain in right foot: Secondary | ICD-10-CM | POA: Diagnosis not present

## 2019-01-04 DIAGNOSIS — M21611 Bunion of right foot: Secondary | ICD-10-CM

## 2019-01-04 DIAGNOSIS — M79672 Pain in left foot: Secondary | ICD-10-CM

## 2019-01-04 NOTE — Patient Instructions (Signed)
Pre-Operative Instructions  Congratulations, you have decided to take an important step towards improving your quality of life.  You can be assured that the doctors and staff at [facility] will be with you every step of the way.  Here are some important things you should know:  1. Plan to be at the surgery center/hospital at least 1 (one) hour prior to your scheduled time, unless otherwise directed by the surgical center/hospital staff.  You must have a responsible adult accompany you, remain during the surgery and drive you home.  Make sure you have directions to the surgical center/hospital to ensure you arrive on time. 2. If you are having surgery at [facility] or [facility], you will need a copy of your medical history and physical form from your family physician within one month prior to the date of surgery. We will give you a form for your primary physician to complete.  3. We make every effort to accommodate the date you request for surgery.  However, there are times where surgery dates or times have to be moved.  We will contact you as soon as possible if a change in schedule is required.   4. No aspirin/ibuprofen for one week before surgery.  If you are on aspirin, any non-steroidal anti-inflammatory medications (Mobic, Aleve, Ibuprofen) should not be taken seven (7) days prior to your surgery.  You make take Tylenol for pain prior to surgery.  5. Medications - If you are taking daily heart and blood pressure medications, seizure, reflux, allergy, asthma, anxiety, pain or diabetes medications, make sure you notify the surgery center/hospital before the day of surgery so they can tell you which medications you should take or avoid the day of surgery. 6. No food or drink after midnight the night before surgery unless directed otherwise by surgical center/hospital staff. 7. No alcoholic beverages 24-hours prior to surgery.  No smoking 24-hours prior or 24-hours after  surgery. 8. Wear loose pants or shorts. They should be loose enough to fit over bandages, boots, and casts. 9. Don't wear slip-on shoes. Sneakers are preferred. 10. Bring your boot with you to the surgery center/hospital.  Also bring crutches or a walker if your physician has prescribed it for you.  If you do not have this equipment, it will be provided for you after surgery. 11. If you have not been contacted by the surgery center/hospital by the day before your surgery, call to confirm the date and time of your surgery. 12. Leave-time from work may vary depending on the type of surgery you have.  Appropriate arrangements should be made prior to surgery with your employer. 13. Prescriptions will be provided immediately following surgery by your doctor.  Fill these as soon as possible after surgery and take the medication as directed. Pain medications will not be refilled on weekends and must be approved by the doctor. 14. Remove nail polish on the operative foot and avoid getting pedicures prior to surgery. 15. Wash the night before surgery.  The night before surgery wash the foot and leg well with water and the antibacterial soap provided. Be sure to pay special attention to beneath the toenails and in between the toes.  Wash for at least three (3) minutes. Rinse thoroughly with water and dry well with a towel.  Perform this wash unless told not to do so by your physician.  Enclosed: 1 Ice pack (please put in freezer the night before surgery)   1 Hibiclens skin cleaner     Pre-op instructions  If you have any questions regarding the instructions, please do not hesitate to call our office.  [place]: [address], [place], [place] [address] -- [phone]  [place]: [address] [place], [place] [address] -- [phone]  [place]: [address]  [place], [place] [address] -- [phone]  [place]: [address], [place], [place] [address] -- [phone]  Website:  [email or web address] 

## 2019-01-08 NOTE — Progress Notes (Signed)
Subjective:   Patient ID: Brianna Snyder, female   DOB: 73 y.o.   MRN: [id number]   HPI Patient states right foot is doing well and I am here for consultation concerning my left bunion   ROS      Objective:  Physical Exam  Neurovascular status intact with good healing right first metatarsal second metatarsal after surgery with prominent first metatarsal left that is very painful when she tries to wear shoe gear and is red     Assessment:  Doing well post foot surgery right with structural bunion deformity left     Plan:  H&P x-rays reviewed and for the left we are going to do a McBride type bunionectomy even though there is elevation of the intermetatarsal angle but I have concerned about cyst formation and also healing.  Educated her on this and allowed her to sign consent form after extensive review understanding will not get full correction from this and all possible complications as outlined.  Patient is scheduled for outpatient surgery for the left foot and signed consent form and is encouraged to call with questions

## 2019-02-20 ENCOUNTER — Telehealth: Payer: Self-pay | Admitting: *Deleted

## 2019-02-20 NOTE — Telephone Encounter (Signed)
"  I'm scheduled for surgery on September 8 with Dr. Paulla Dolly.  I need to change that to September 15."  I'll get it rescheduled.  "Does he only do surgeries on Tuesdays?"  Yes, he only does surgeries on Tuesday.  "That's what I thought, thank you so much."  I changed her surgical date from 03/21/2019 to 03/28/2019 via the surgical center's [facility] [employer] [facility].

## 2019-03-21 ENCOUNTER — Telehealth: Payer: Self-pay | Admitting: *Deleted

## 2019-03-21 NOTE — Telephone Encounter (Signed)
"  I need to reschedule my surgery."  What date were you scheduled for?  "I'm scheduled for September 15 but I'd like to reschedule it to April 25, 2019."  April 25, 2019 is available.  I'll get it rescheduled to that date.  I rescheduled her surgery schedule from 03/28/2019 to 04/25/2019 via the surgical center's [employer] [facility].

## 2019-04-24 ENCOUNTER — Telehealth: Payer: Self-pay | Admitting: *Deleted

## 2019-04-24 NOTE — Telephone Encounter (Signed)
"  I'd scheduled to have surgery tomorrow.  I want to reschedule it to November 10.  I'm sorry for the late call."  May 23, 2019 is available.  I will get it rescheduled to that date.  I called and informed Caren Griffins, surgery scheduler at [facility], to reschedule Ms. Hemme's surgery from 04/25/19 to 05/23/2019.  She informed me that this is the third time the patient has rescheduled her surgery date.

## 2019-05-04 DIAGNOSIS — Z23 Encounter for immunization: Secondary | ICD-10-CM | POA: Diagnosis not present

## 2019-05-23 ENCOUNTER — Encounter: Payer: Self-pay | Admitting: Podiatry

## 2019-05-23 DIAGNOSIS — I10 Essential (primary) hypertension: Secondary | ICD-10-CM | POA: Diagnosis not present

## 2019-05-23 DIAGNOSIS — M2012 Hallux valgus (acquired), left foot: Secondary | ICD-10-CM

## 2019-06-01 ENCOUNTER — Encounter: Payer: Medicare Other | Admitting: Podiatry

## 2019-06-01 ENCOUNTER — Encounter: Payer: Self-pay | Admitting: Podiatry

## 2019-06-01 ENCOUNTER — Other Ambulatory Visit: Payer: Self-pay

## 2019-06-01 ENCOUNTER — Ambulatory Visit (INDEPENDENT_AMBULATORY_CARE_PROVIDER_SITE_OTHER): Payer: Medicare Other | Admitting: Podiatry

## 2019-06-01 ENCOUNTER — Ambulatory Visit (INDEPENDENT_AMBULATORY_CARE_PROVIDER_SITE_OTHER): Payer: Medicare Other

## 2019-06-01 VITALS — BP 121/72 | HR 77 | Resp 16

## 2019-06-01 DIAGNOSIS — M21612 Bunion of left foot: Secondary | ICD-10-CM

## 2019-06-01 DIAGNOSIS — M2012 Hallux valgus (acquired), left foot: Secondary | ICD-10-CM

## 2019-06-01 NOTE — Progress Notes (Signed)
Subjective:   Patient ID: Brianna Snyder, female   DOB: 73 y.o.   MRN: [id number]   HPI Patient states doing really well with left foot with minimal discomfort   ROS      Objective:  Physical Exam  Neurovascular status intact negative Bevelyn Buckles' sign noted wound edges well coapted left first metatarsal with minimal inflammation pain noted     Assessment:  Doing well post McBride bunionectomy left     Plan:  Advised on continuation of immobilization elevation compression reapplied sterile dressing.  Reappoint for follow-up visit and eventually may return to normal shoe gear  X-rays indicate satisfactory section of bone no signs of pathology currently

## 2019-06-15 ENCOUNTER — Ambulatory Visit (INDEPENDENT_AMBULATORY_CARE_PROVIDER_SITE_OTHER): Payer: Medicare Other

## 2019-06-15 ENCOUNTER — Other Ambulatory Visit: Payer: Self-pay

## 2019-06-15 ENCOUNTER — Ambulatory Visit (INDEPENDENT_AMBULATORY_CARE_PROVIDER_SITE_OTHER): Payer: Medicare Other | Admitting: Podiatry

## 2019-06-15 ENCOUNTER — Encounter: Payer: Self-pay | Admitting: Podiatry

## 2019-06-15 DIAGNOSIS — M2012 Hallux valgus (acquired), left foot: Secondary | ICD-10-CM

## 2019-06-15 DIAGNOSIS — Z09 Encounter for follow-up examination after completed treatment for conditions other than malignant neoplasm: Secondary | ICD-10-CM

## 2019-06-15 NOTE — Progress Notes (Signed)
Subjective:   Patient ID: Brianna Snyder, female   DOB: 73 y.o.   MRN: [id number]   HPI Patient states doing very well with foot   ROS      Objective:  Physical Exam  Neurovascular status intact negative Bevelyn Buckles' sign noted with well-healed surgical site left first metatarsal     Assessment:  Doing well post McBride bunionectomy left     Plan:  Reviewed condition and recommended gradual increase in activity shoe usage and dispensed ankle compression stocking for usage  X-rays were negative for signs of pathology with good resection of bone medial side first metatarsal

## 2019-09-04 DIAGNOSIS — Z961 Presence of intraocular lens: Secondary | ICD-10-CM | POA: Diagnosis not present

## 2019-09-04 DIAGNOSIS — H524 Presbyopia: Secondary | ICD-10-CM | POA: Diagnosis not present

## 2019-09-04 DIAGNOSIS — H52203 Unspecified astigmatism, bilateral: Secondary | ICD-10-CM | POA: Diagnosis not present

## 2019-09-08 DIAGNOSIS — Z23 Encounter for immunization: Secondary | ICD-10-CM | POA: Diagnosis not present

## 2019-10-06 DIAGNOSIS — Z23 Encounter for immunization: Secondary | ICD-10-CM | POA: Diagnosis not present

## 2019-11-16 DIAGNOSIS — R7309 Other abnormal glucose: Secondary | ICD-10-CM | POA: Diagnosis not present

## 2019-11-16 DIAGNOSIS — E538 Deficiency of other specified B group vitamins: Secondary | ICD-10-CM | POA: Diagnosis not present

## 2019-11-16 DIAGNOSIS — I700 Atherosclerosis of aorta: Secondary | ICD-10-CM | POA: Diagnosis not present

## 2019-11-16 DIAGNOSIS — Z0000 Encounter for general adult medical examination without abnormal findings: Secondary | ICD-10-CM | POA: Diagnosis not present

## 2019-11-16 DIAGNOSIS — Z1389 Encounter for screening for other disorder: Secondary | ICD-10-CM | POA: Diagnosis not present

## 2019-11-16 DIAGNOSIS — I10 Essential (primary) hypertension: Secondary | ICD-10-CM | POA: Diagnosis not present

## 2019-11-16 DIAGNOSIS — E559 Vitamin D deficiency, unspecified: Secondary | ICD-10-CM | POA: Diagnosis not present

## 2019-11-16 DIAGNOSIS — E782 Mixed hyperlipidemia: Secondary | ICD-10-CM | POA: Diagnosis not present

## 2019-11-16 DIAGNOSIS — N898 Other specified noninflammatory disorders of vagina: Secondary | ICD-10-CM | POA: Diagnosis not present

## 2020-01-24 ENCOUNTER — Other Ambulatory Visit: Payer: Self-pay | Admitting: Internal Medicine

## 2020-01-26 ENCOUNTER — Other Ambulatory Visit: Payer: Self-pay | Admitting: Internal Medicine

## 2020-01-26 DIAGNOSIS — N644 Mastodynia: Secondary | ICD-10-CM

## 2020-02-14 ENCOUNTER — Ambulatory Visit
Admission: RE | Admit: 2020-02-14 | Discharge: 2020-02-14 | Disposition: A | Discharge status: Home / Self Care | Payer: Medicare Other | Source: Ambulatory Visit | Attending: Internal Medicine | Admitting: Internal Medicine

## 2020-02-14 ENCOUNTER — Other Ambulatory Visit: Payer: Self-pay

## 2020-02-14 DIAGNOSIS — N644 Mastodynia: Secondary | ICD-10-CM

## 2020-03-14 DIAGNOSIS — E781 Pure hyperglyceridemia: Secondary | ICD-10-CM | POA: Diagnosis not present

## 2020-03-14 DIAGNOSIS — I10 Essential (primary) hypertension: Secondary | ICD-10-CM | POA: Diagnosis not present

## 2020-03-14 DIAGNOSIS — E782 Mixed hyperlipidemia: Secondary | ICD-10-CM | POA: Diagnosis not present

## 2020-05-24 DIAGNOSIS — Z23 Encounter for immunization: Secondary | ICD-10-CM | POA: Diagnosis not present

## 2020-08-19 DIAGNOSIS — R059 Cough, unspecified: Secondary | ICD-10-CM | POA: Diagnosis not present

## 2020-08-19 DIAGNOSIS — J209 Acute bronchitis, unspecified: Secondary | ICD-10-CM | POA: Diagnosis not present

## 2020-09-03 DIAGNOSIS — H52203 Unspecified astigmatism, bilateral: Secondary | ICD-10-CM | POA: Diagnosis not present

## 2020-09-03 DIAGNOSIS — H524 Presbyopia: Secondary | ICD-10-CM | POA: Diagnosis not present

## 2020-09-03 DIAGNOSIS — Z961 Presence of intraocular lens: Secondary | ICD-10-CM | POA: Diagnosis not present

## 2020-10-14 DIAGNOSIS — M65331 Trigger finger, right middle finger: Secondary | ICD-10-CM | POA: Diagnosis not present

## 2020-11-14 DIAGNOSIS — M65331 Trigger finger, right middle finger: Secondary | ICD-10-CM | POA: Diagnosis not present

## 2020-11-26 DIAGNOSIS — Z4889 Encounter for other specified surgical aftercare: Secondary | ICD-10-CM | POA: Diagnosis not present

## 2020-11-26 DIAGNOSIS — M65331 Trigger finger, right middle finger: Secondary | ICD-10-CM | POA: Diagnosis not present

## 2020-12-03 DIAGNOSIS — Z4889 Encounter for other specified surgical aftercare: Secondary | ICD-10-CM | POA: Diagnosis not present

## 2020-12-03 DIAGNOSIS — M65331 Trigger finger, right middle finger: Secondary | ICD-10-CM | POA: Diagnosis not present

## 2020-12-11 DIAGNOSIS — Z4889 Encounter for other specified surgical aftercare: Secondary | ICD-10-CM | POA: Diagnosis not present

## 2020-12-11 DIAGNOSIS — M65331 Trigger finger, right middle finger: Secondary | ICD-10-CM | POA: Diagnosis not present

## 2020-12-17 DIAGNOSIS — Z4889 Encounter for other specified surgical aftercare: Secondary | ICD-10-CM | POA: Diagnosis not present

## 2020-12-17 DIAGNOSIS — M65331 Trigger finger, right middle finger: Secondary | ICD-10-CM | POA: Diagnosis not present

## 2020-12-23 DIAGNOSIS — Z4889 Encounter for other specified surgical aftercare: Secondary | ICD-10-CM | POA: Diagnosis not present

## 2020-12-23 DIAGNOSIS — M65331 Trigger finger, right middle finger: Secondary | ICD-10-CM | POA: Diagnosis not present

## 2021-01-01 DIAGNOSIS — Z4889 Encounter for other specified surgical aftercare: Secondary | ICD-10-CM | POA: Diagnosis not present

## 2021-01-01 DIAGNOSIS — M65331 Trigger finger, right middle finger: Secondary | ICD-10-CM | POA: Diagnosis not present

## 2021-01-07 DIAGNOSIS — Z4889 Encounter for other specified surgical aftercare: Secondary | ICD-10-CM | POA: Diagnosis not present

## 2021-01-07 DIAGNOSIS — M65331 Trigger finger, right middle finger: Secondary | ICD-10-CM | POA: Diagnosis not present

## 2021-01-23 DIAGNOSIS — Z4889 Encounter for other specified surgical aftercare: Secondary | ICD-10-CM | POA: Diagnosis not present

## 2021-01-23 DIAGNOSIS — M65331 Trigger finger, right middle finger: Secondary | ICD-10-CM | POA: Diagnosis not present

## 2021-01-30 DIAGNOSIS — I10 Essential (primary) hypertension: Secondary | ICD-10-CM | POA: Diagnosis not present

## 2021-01-30 DIAGNOSIS — I700 Atherosclerosis of aorta: Secondary | ICD-10-CM | POA: Diagnosis not present

## 2021-01-30 DIAGNOSIS — E559 Vitamin D deficiency, unspecified: Secondary | ICD-10-CM | POA: Diagnosis not present

## 2021-01-30 DIAGNOSIS — Z1389 Encounter for screening for other disorder: Secondary | ICD-10-CM | POA: Diagnosis not present

## 2021-01-30 DIAGNOSIS — E538 Deficiency of other specified B group vitamins: Secondary | ICD-10-CM | POA: Diagnosis not present

## 2021-01-30 DIAGNOSIS — Z0000 Encounter for general adult medical examination without abnormal findings: Secondary | ICD-10-CM | POA: Diagnosis not present

## 2021-01-30 DIAGNOSIS — R7303 Prediabetes: Secondary | ICD-10-CM | POA: Diagnosis not present

## 2021-01-30 DIAGNOSIS — E782 Mixed hyperlipidemia: Secondary | ICD-10-CM | POA: Diagnosis not present

## 2021-01-30 DIAGNOSIS — R7309 Other abnormal glucose: Secondary | ICD-10-CM | POA: Diagnosis not present

## 2021-01-30 DIAGNOSIS — Z23 Encounter for immunization: Secondary | ICD-10-CM | POA: Diagnosis not present

## 2021-02-07 DIAGNOSIS — M65331 Trigger finger, right middle finger: Secondary | ICD-10-CM | POA: Diagnosis not present

## 2021-02-07 DIAGNOSIS — Z4889 Encounter for other specified surgical aftercare: Secondary | ICD-10-CM | POA: Diagnosis not present

## 2021-05-12 DIAGNOSIS — Z23 Encounter for immunization: Secondary | ICD-10-CM | POA: Diagnosis not present

## 2021-05-30 DIAGNOSIS — L218 Other seborrheic dermatitis: Secondary | ICD-10-CM | POA: Diagnosis not present

## 2021-05-30 DIAGNOSIS — D2261 Melanocytic nevi of right upper limb, including shoulder: Secondary | ICD-10-CM | POA: Diagnosis not present

## 2021-05-30 DIAGNOSIS — L820 Inflamed seborrheic keratosis: Secondary | ICD-10-CM | POA: Diagnosis not present

## 2021-05-30 DIAGNOSIS — D1801 Hemangioma of skin and subcutaneous tissue: Secondary | ICD-10-CM | POA: Diagnosis not present

## 2021-05-30 DIAGNOSIS — L821 Other seborrheic keratosis: Secondary | ICD-10-CM | POA: Diagnosis not present

## 2021-05-30 DIAGNOSIS — L738 Other specified follicular disorders: Secondary | ICD-10-CM | POA: Diagnosis not present

## 2021-05-30 DIAGNOSIS — L918 Other hypertrophic disorders of the skin: Secondary | ICD-10-CM | POA: Diagnosis not present

## 2021-09-09 DIAGNOSIS — Z961 Presence of intraocular lens: Secondary | ICD-10-CM | POA: Diagnosis not present

## 2021-09-09 DIAGNOSIS — H524 Presbyopia: Secondary | ICD-10-CM | POA: Diagnosis not present

## 2021-09-09 DIAGNOSIS — H52203 Unspecified astigmatism, bilateral: Secondary | ICD-10-CM | POA: Diagnosis not present

## 2021-11-06 DIAGNOSIS — K5730 Diverticulosis of large intestine without perforation or abscess without bleeding: Secondary | ICD-10-CM | POA: Diagnosis not present

## 2021-11-06 DIAGNOSIS — Z86010 Personal history of colonic polyps: Secondary | ICD-10-CM | POA: Diagnosis not present

## 2021-11-06 DIAGNOSIS — D122 Benign neoplasm of ascending colon: Secondary | ICD-10-CM | POA: Diagnosis not present

## 2021-11-10 DIAGNOSIS — D122 Benign neoplasm of ascending colon: Secondary | ICD-10-CM | POA: Diagnosis not present

## 2022-02-04 DIAGNOSIS — I10 Essential (primary) hypertension: Secondary | ICD-10-CM | POA: Diagnosis not present

## 2022-02-04 DIAGNOSIS — I700 Atherosclerosis of aorta: Secondary | ICD-10-CM | POA: Diagnosis not present

## 2022-02-04 DIAGNOSIS — E559 Vitamin D deficiency, unspecified: Secondary | ICD-10-CM | POA: Diagnosis not present

## 2022-02-04 DIAGNOSIS — R7303 Prediabetes: Secondary | ICD-10-CM | POA: Diagnosis not present

## 2022-02-04 DIAGNOSIS — Z1331 Encounter for screening for depression: Secondary | ICD-10-CM | POA: Diagnosis not present

## 2022-02-04 DIAGNOSIS — R1310 Dysphagia, unspecified: Secondary | ICD-10-CM | POA: Diagnosis not present

## 2022-02-04 DIAGNOSIS — R7309 Other abnormal glucose: Secondary | ICD-10-CM | POA: Diagnosis not present

## 2022-02-04 DIAGNOSIS — Z0000 Encounter for general adult medical examination without abnormal findings: Secondary | ICD-10-CM | POA: Diagnosis not present

## 2022-02-04 DIAGNOSIS — K589 Irritable bowel syndrome without diarrhea: Secondary | ICD-10-CM | POA: Diagnosis not present

## 2022-02-04 DIAGNOSIS — E782 Mixed hyperlipidemia: Secondary | ICD-10-CM | POA: Diagnosis not present

## 2022-02-04 DIAGNOSIS — E538 Deficiency of other specified B group vitamins: Secondary | ICD-10-CM | POA: Diagnosis not present

## 2022-02-12 ENCOUNTER — Telehealth (HOSPITAL_COMMUNITY): Payer: Self-pay

## 2022-02-12 NOTE — Telephone Encounter (Signed)
Attempted to contact patient to schedule Modified Barium Swallow - left voicemail. 

## 2022-02-13 ENCOUNTER — Other Ambulatory Visit (HOSPITAL_COMMUNITY): Payer: Self-pay | Admitting: *Deleted

## 2022-02-13 DIAGNOSIS — R1310 Dysphagia, unspecified: Secondary | ICD-10-CM

## 2022-02-13 DIAGNOSIS — R059 Cough, unspecified: Secondary | ICD-10-CM

## 2022-02-16 ENCOUNTER — Other Ambulatory Visit: Payer: Self-pay | Admitting: Internal Medicine

## 2022-02-16 DIAGNOSIS — Z1231 Encounter for screening mammogram for malignant neoplasm of breast: Secondary | ICD-10-CM

## 2022-02-25 ENCOUNTER — Ambulatory Visit (HOSPITAL_COMMUNITY)
Admission: RE | Admit: 2022-02-25 | Discharge: 2022-02-25 | Disposition: A | Discharge status: Home / Self Care | Payer: Medicare Other | Source: Ambulatory Visit | Attending: Internal Medicine | Admitting: Internal Medicine

## 2022-02-25 ENCOUNTER — Ambulatory Visit
Admission: RE | Admit: 2022-02-25 | Discharge: 2022-02-25 | Disposition: A | Discharge status: Home / Self Care | Payer: Medicare Other | Source: Ambulatory Visit | Attending: Internal Medicine | Admitting: Internal Medicine

## 2022-02-25 DIAGNOSIS — R059 Cough, unspecified: Secondary | ICD-10-CM | POA: Insufficient documentation

## 2022-02-25 DIAGNOSIS — R1310 Dysphagia, unspecified: Secondary | ICD-10-CM | POA: Diagnosis not present

## 2022-02-25 DIAGNOSIS — Z1231 Encounter for screening mammogram for malignant neoplasm of breast: Secondary | ICD-10-CM | POA: Diagnosis not present

## 2022-02-25 NOTE — Therapy (Signed)
Modified Barium Swallow Progress Note  Patient Details  Name: Brianna Snyder MRN: [id number] Date of Birth: January 19, 1946  Today's Date: 02/25/2022  Modified Barium Swallow completed.  Full report located under Chart Review in the Imaging Section.  Brief recommendations include the following:  Clinical Impression  Patient presents with an oropharyngeal and cervical esophageal phase of swallow that all appear WFL.  Her only difficulty during oral phase was bolus cohesion with thin liquid barium and 13 mm barium tablet resulting in brief transit delay of barium tablet through oropharynx. In addition, patient exhibited swallow initiation delay to level of pyriform sinus with thin liquids when swallowed with barium tablet but timely swallow initiation when thin liquids consumed on their own. No significant delays with puree or mechanical soft solids and no pharyngeal residuals observed s/p swallows of any tested consistencies (thin and nectar thick liquids, puree, mechanical soft solids and barium tablet). During cervical esophageal phase of swallow, presence of cervical osteophytes observed but they did not appear to impinge upon pharynx significantly. Potential for cervical osteophytes to be contributing to patient's inconsistent globus sensation as well as inconsistent difficulty swallowing solids. (describes needing to drink more sips of liquids at times).   Swallow Evaluation Recommendations       SLP Diet Recommendations: Regular solids;Thin liquid   Liquid Administration via: Cup;Straw   Medication Administration: Whole meds with liquid           Postural Changes: Seated upright at 90 degrees           Sonia Baller, MA, CCC-SLP Speech Therapy

## 2022-03-03 ENCOUNTER — Other Ambulatory Visit: Payer: Self-pay | Admitting: Obstetrics and Gynecology

## 2022-03-03 DIAGNOSIS — Z01419 Encounter for gynecological examination (general) (routine) without abnormal findings: Secondary | ICD-10-CM | POA: Diagnosis not present

## 2022-03-03 DIAGNOSIS — Z1231 Encounter for screening mammogram for malignant neoplasm of breast: Secondary | ICD-10-CM | POA: Diagnosis not present

## 2022-03-03 DIAGNOSIS — Z13820 Encounter for screening for osteoporosis: Secondary | ICD-10-CM | POA: Diagnosis not present

## 2022-03-03 DIAGNOSIS — Z124 Encounter for screening for malignant neoplasm of cervix: Secondary | ICD-10-CM | POA: Diagnosis not present

## 2022-03-03 DIAGNOSIS — Z6826 Body mass index (BMI) 26.0-26.9, adult: Secondary | ICD-10-CM | POA: Diagnosis not present

## 2022-03-03 DIAGNOSIS — N905 Atrophy of vulva: Secondary | ICD-10-CM | POA: Diagnosis not present

## 2022-03-03 DIAGNOSIS — Z1211 Encounter for screening for malignant neoplasm of colon: Secondary | ICD-10-CM | POA: Diagnosis not present

## 2022-03-03 NOTE — Progress Notes (Signed)
02/25/22 1200  SLP Visit Information  SLP Received On 02/25/22  Subjective  Subjective pleasant, interested to learn about her swallow function  Patient/Family Stated Goal learn what is causing her dysphagia  Pain Assessment  Pain Assessment No/denies pain  General Information  Date of Onset 02/13/22  HPI Brianna Snyder is a 76 y.o. female with PMH: HTN, CVA in 2013. (she denies any current residual deficits). She presented for this OP MBS due to c/o of swallowing that started 3-5 years ago but with patient reporting some increase in difficulty in recent months. She also told SLP that her PCP had told her 3-5 years ago that she did not have as much space for swallowing. (she was not able to fully describe but when SLP asked if it was something perhaps anatomical, she said "yes". She also indicated that her PCP may want her to see an ENT as well.  Type of Study MBS-Modified Barium Swallow Study  Previous Swallow Assessment none found  Diet Prior to this Study Regular;Thin liquids  Temperature Spikes Noted N/A  Respiratory Status Room air  History of Recent Intubation No  Behavior/Cognition Alert;Cooperative;Pleasant mood  Oral Cavity Assessment WFL  Oral Care Completed by SLP No  Vision Functional for self feeding  Self-Feeding Abilities Able to feed self  Patient Positioning Upright in chair  Baseline Vocal Quality Normal  Volitional Cough Strong  Volitional Swallow Able to elicit  Anatomy WFL  Pharyngeal Secretions Not observed secondary MBS  Oral Preparation/Oral Phase  Oral Phase Impaired  Oral - Solids  Oral - Pill Decreased bolus cohesion  Oral Phase - Comment  Oral Phase - Comment Patient had difficulty transiting 13 mm barium tablet with thin liquids barium with brief delay when pill in oropharynx  Pharyngeal Phase  Pharyngeal Phase Impaired  Pharyngeal - Thin  Pharyngeal- Thin Straw Delayed swallow initiation-pyriform sinuses;Delayed swallow initiation-vallecula   Pharyngeal Phase - Comment  Pharyngeal Comment When patient consuming barium tablet with thin liquid barium, she exhibited swallow initiation delay with thin liquids as she was still trying to orall manipulate and transit pill while drinking liquids.  Cervical Esophageal Phase  Cervical Esophageal Phase Impaired  Cervical Esophageal Phase - Comment  Cervical Esophageal Comment cervical osteophytes observed by SLP and radiology PA which only appeared to mildly impinge upon her pharynx  Clinical Impression  Clinical Impression Patient presents with an oropharyngeal and cervical esophageal phase of swallow that all appear WFL.  Her only difficulty during oral phase was bolus cohesion with thin liquid barium and 13 mm barium tablet resulting in brief transit delay of barium tablet through oropharynx. In addition, patient exhibited swallow initiation delay to level of pyriform sinus with thin liquids when swallowed with barium tablet but timely swallow initiation when thin liquids consumed on their own. No significant delays with puree or mechanical soft solids and no pharyngeal residuals observed s/p swallows of any tested consistencies (thin and nectar thick liquids, puree, mechanical soft solids and barium tablet). During cervical esophageal phase of swallow, presence of cervical osteophytes observed but they did not appear to impinge upon pharynx significantly. Potential for cervical osteophytes to be contributing to patient's inconsistent globus sensation as well as inconsistent difficulty swallowing solids. (describes needing to drink more sips of liquids at times).  SLP Visit Diagnosis Dysphagia, oropharyngeal phase (R13.12)  Impact on safety and function No limitations  Swallow Evaluation Recommendations  SLP Diet Recommendations Regular solids;Thin liquid  Liquid Administration via Cup;Straw  Medication Administration Whole meds with  liquid  Postural Changes Seated upright at 90 degrees   Individuals Consulted  Consulted and Agree with Results and Recommendations Patient  Report Sent to  Referring physician  SLP Time Calculation  SLP Start Time (ACUTE ONLY) 1135  SLP Stop Time (ACUTE ONLY) 1200  SLP Time Calculation (min) (ACUTE ONLY) 25 min  SLP Evaluations  $ SLP Speech Visit 1 Visit  SLP Evaluations  $MBS Swallow 1 Procedure   Sonia Baller, MA, CCC-SLP Speech Therapy

## 2022-03-06 ENCOUNTER — Ambulatory Visit: Payer: Medicare Other

## 2022-04-27 DIAGNOSIS — Z23 Encounter for immunization: Secondary | ICD-10-CM | POA: Diagnosis not present

## 2022-05-04 DIAGNOSIS — R1314 Dysphagia, pharyngoesophageal phase: Secondary | ICD-10-CM | POA: Diagnosis not present

## 2022-09-15 DIAGNOSIS — H524 Presbyopia: Secondary | ICD-10-CM | POA: Diagnosis not present

## 2022-09-15 DIAGNOSIS — H16223 Keratoconjunctivitis sicca, not specified as Sjogren's, bilateral: Secondary | ICD-10-CM | POA: Diagnosis not present

## 2022-09-15 DIAGNOSIS — Z961 Presence of intraocular lens: Secondary | ICD-10-CM | POA: Diagnosis not present

## 2022-10-22 DIAGNOSIS — K589 Irritable bowel syndrome without diarrhea: Secondary | ICD-10-CM | POA: Diagnosis not present

## 2023-02-08 DIAGNOSIS — I10 Essential (primary) hypertension: Secondary | ICD-10-CM | POA: Diagnosis not present

## 2023-02-08 DIAGNOSIS — Z23 Encounter for immunization: Secondary | ICD-10-CM | POA: Diagnosis not present

## 2023-02-08 DIAGNOSIS — E782 Mixed hyperlipidemia: Secondary | ICD-10-CM | POA: Diagnosis not present

## 2023-02-08 DIAGNOSIS — R1310 Dysphagia, unspecified: Secondary | ICD-10-CM | POA: Diagnosis not present

## 2023-02-08 DIAGNOSIS — Z79899 Other long term (current) drug therapy: Secondary | ICD-10-CM | POA: Diagnosis not present

## 2023-02-08 DIAGNOSIS — Z0000 Encounter for general adult medical examination without abnormal findings: Secondary | ICD-10-CM | POA: Diagnosis not present

## 2023-02-08 DIAGNOSIS — R7303 Prediabetes: Secondary | ICD-10-CM | POA: Diagnosis not present

## 2023-02-08 DIAGNOSIS — I700 Atherosclerosis of aorta: Secondary | ICD-10-CM | POA: Diagnosis not present

## 2023-02-08 DIAGNOSIS — E538 Deficiency of other specified B group vitamins: Secondary | ICD-10-CM | POA: Diagnosis not present

## 2023-02-08 DIAGNOSIS — Z1331 Encounter for screening for depression: Secondary | ICD-10-CM | POA: Diagnosis not present

## 2023-02-08 DIAGNOSIS — K589 Irritable bowel syndrome without diarrhea: Secondary | ICD-10-CM | POA: Diagnosis not present

## 2023-02-08 DIAGNOSIS — E559 Vitamin D deficiency, unspecified: Secondary | ICD-10-CM | POA: Diagnosis not present

## 2023-03-10 DIAGNOSIS — R748 Abnormal levels of other serum enzymes: Secondary | ICD-10-CM | POA: Diagnosis not present

## 2023-03-24 DIAGNOSIS — D2261 Melanocytic nevi of right upper limb, including shoulder: Secondary | ICD-10-CM | POA: Diagnosis not present

## 2023-03-24 DIAGNOSIS — L918 Other hypertrophic disorders of the skin: Secondary | ICD-10-CM | POA: Diagnosis not present

## 2023-03-24 DIAGNOSIS — L738 Other specified follicular disorders: Secondary | ICD-10-CM | POA: Diagnosis not present

## 2023-03-24 DIAGNOSIS — L820 Inflamed seborrheic keratosis: Secondary | ICD-10-CM | POA: Diagnosis not present

## 2023-03-24 DIAGNOSIS — L821 Other seborrheic keratosis: Secondary | ICD-10-CM | POA: Diagnosis not present

## 2023-03-24 DIAGNOSIS — D1801 Hemangioma of skin and subcutaneous tissue: Secondary | ICD-10-CM | POA: Diagnosis not present

## 2023-04-19 ENCOUNTER — Other Ambulatory Visit: Payer: Self-pay | Admitting: Internal Medicine

## 2023-04-19 DIAGNOSIS — Z1231 Encounter for screening mammogram for malignant neoplasm of breast: Secondary | ICD-10-CM

## 2023-04-22 ENCOUNTER — Ambulatory Visit
Admission: RE | Admit: 2023-04-22 | Discharge: 2023-04-22 | Disposition: A | Discharge status: Home / Self Care | Payer: Medicare Other | Source: Ambulatory Visit | Attending: Internal Medicine | Admitting: Internal Medicine

## 2023-04-22 DIAGNOSIS — Z1231 Encounter for screening mammogram for malignant neoplasm of breast: Secondary | ICD-10-CM

## 2023-05-05 DIAGNOSIS — Z23 Encounter for immunization: Secondary | ICD-10-CM | POA: Diagnosis not present

## 2023-08-03 DIAGNOSIS — L821 Other seborrheic keratosis: Secondary | ICD-10-CM | POA: Diagnosis not present

## 2023-08-03 DIAGNOSIS — L918 Other hypertrophic disorders of the skin: Secondary | ICD-10-CM | POA: Diagnosis not present

## 2023-10-01 DIAGNOSIS — Z961 Presence of intraocular lens: Secondary | ICD-10-CM | POA: Diagnosis not present

## 2023-10-01 DIAGNOSIS — H04123 Dry eye syndrome of bilateral lacrimal glands: Secondary | ICD-10-CM | POA: Diagnosis not present

## 2023-10-01 DIAGNOSIS — H43813 Vitreous degeneration, bilateral: Secondary | ICD-10-CM | POA: Diagnosis not present

## 2023-10-07 DIAGNOSIS — J208 Acute bronchitis due to other specified organisms: Secondary | ICD-10-CM | POA: Diagnosis not present

## 2023-12-14 DIAGNOSIS — L821 Other seborrheic keratosis: Secondary | ICD-10-CM | POA: Diagnosis not present

## 2023-12-14 DIAGNOSIS — L814 Other melanin hyperpigmentation: Secondary | ICD-10-CM | POA: Diagnosis not present

## 2023-12-14 DIAGNOSIS — B078 Other viral warts: Secondary | ICD-10-CM | POA: Diagnosis not present

## 2024-02-09 DIAGNOSIS — E782 Mixed hyperlipidemia: Secondary | ICD-10-CM | POA: Diagnosis not present

## 2024-02-09 DIAGNOSIS — E559 Vitamin D deficiency, unspecified: Secondary | ICD-10-CM | POA: Diagnosis not present

## 2024-02-09 DIAGNOSIS — K589 Irritable bowel syndrome without diarrhea: Secondary | ICD-10-CM | POA: Diagnosis not present

## 2024-02-09 DIAGNOSIS — E538 Deficiency of other specified B group vitamins: Secondary | ICD-10-CM | POA: Diagnosis not present

## 2024-02-09 DIAGNOSIS — R7303 Prediabetes: Secondary | ICD-10-CM | POA: Diagnosis not present

## 2024-02-09 DIAGNOSIS — I10 Essential (primary) hypertension: Secondary | ICD-10-CM | POA: Diagnosis not present

## 2024-02-09 DIAGNOSIS — Z1331 Encounter for screening for depression: Secondary | ICD-10-CM | POA: Diagnosis not present

## 2024-02-09 DIAGNOSIS — R1310 Dysphagia, unspecified: Secondary | ICD-10-CM | POA: Diagnosis not present

## 2024-02-09 DIAGNOSIS — Z79899 Other long term (current) drug therapy: Secondary | ICD-10-CM | POA: Diagnosis not present

## 2024-02-09 DIAGNOSIS — Z0000 Encounter for general adult medical examination without abnormal findings: Secondary | ICD-10-CM | POA: Diagnosis not present

## 2024-03-09 DIAGNOSIS — R748 Abnormal levels of other serum enzymes: Secondary | ICD-10-CM | POA: Diagnosis not present

## 2024-03-09 DIAGNOSIS — Z9049 Acquired absence of other specified parts of digestive tract: Secondary | ICD-10-CM | POA: Diagnosis not present

## 2024-03-09 DIAGNOSIS — K760 Fatty (change of) liver, not elsewhere classified: Secondary | ICD-10-CM | POA: Diagnosis not present

## 2024-03-15 DIAGNOSIS — R748 Abnormal levels of other serum enzymes: Secondary | ICD-10-CM | POA: Diagnosis not present

## 2024-04-20 DIAGNOSIS — B029 Zoster without complications: Secondary | ICD-10-CM | POA: Diagnosis not present

## 2024-04-24 DIAGNOSIS — L821 Other seborrheic keratosis: Secondary | ICD-10-CM | POA: Diagnosis not present

## 2024-04-24 DIAGNOSIS — L738 Other specified follicular disorders: Secondary | ICD-10-CM | POA: Diagnosis not present

## 2024-04-24 DIAGNOSIS — B0089 Other herpesviral infection: Secondary | ICD-10-CM | POA: Diagnosis not present

## 2024-04-24 DIAGNOSIS — D1801 Hemangioma of skin and subcutaneous tissue: Secondary | ICD-10-CM | POA: Diagnosis not present

## 2024-04-24 DIAGNOSIS — L814 Other melanin hyperpigmentation: Secondary | ICD-10-CM | POA: Diagnosis not present

## 2024-05-03 DIAGNOSIS — Z23 Encounter for immunization: Secondary | ICD-10-CM | POA: Diagnosis not present

## 2024-05-17 ENCOUNTER — Ambulatory Visit (INDEPENDENT_AMBULATORY_CARE_PROVIDER_SITE_OTHER)

## 2024-05-17 ENCOUNTER — Ambulatory Visit (INDEPENDENT_AMBULATORY_CARE_PROVIDER_SITE_OTHER): Admitting: Podiatry

## 2024-05-17 ENCOUNTER — Encounter: Payer: Self-pay | Admitting: Podiatry

## 2024-05-17 DIAGNOSIS — M7752 Other enthesopathy of left foot: Secondary | ICD-10-CM | POA: Diagnosis not present

## 2024-05-17 DIAGNOSIS — D3610 Benign neoplasm of peripheral nerves and autonomic nervous system, unspecified: Secondary | ICD-10-CM

## 2024-05-17 MED ORDER — TRIAMCINOLONE ACETONIDE 10 MG/ML IJ SUSP
10.0000 mg | Freq: Once | INTRAMUSCULAR | Status: AC
  Administered 2024-05-17: 10 mg via INTRA_ARTICULAR

## 2024-05-18 NOTE — Progress Notes (Signed)
 Subjective:   Patient ID: Brianna Snyder, female   DOB: 78 y.o.   MRN: [id number]   HPI Patient presents with a several year history of pain in the left foot that has gotten worse with shooting like discomforts into the adjacent digits and inability to wear shoe gear with any degree of comfort now.  Also states she feels like she has a lump there and patient does not smoke and does not like to be active and has had to make a lot of shoe gear changes   Review of Systems  All other systems reviewed and are negative.       Objective:  Physical Exam Vitals and nursing note reviewed.  Constitutional:      Appearance: She is well-developed.  Pulmonary:     Effort: Pulmonary effort is normal.  Musculoskeletal:        General: Normal range of motion.  Skin:    General: Skin is warm.  Neurological:     Mental Status: She is alert.     Neurovascular status found to be intact muscle strength found to be adequate range of motion adequate with the patient noted to have exquisite discomfort with a positive Mulder sign third interspace left foot with fluid buildup around the area and slight swelling  plantar.  Patient has good digital perfusion well oriented x 3     Assessment:  Strong possibility for neuroma symptomatology left cannot rule out inflammatory fluid condition     Plan:  H&P reviewed this with patient at this point I went ahead and I did do sterile prep I injected the third interspace into the nerve III milligram dexamethasone Kenalog  5 mg Xylocaine Marcaine mixture and I want to see an immediate response and then response over the next few weeks.  Did discuss this may require resection  X-rays indicate that there is no signs of arthritis in the area or mechanical dysfunction

## 2024-05-26 ENCOUNTER — Other Ambulatory Visit: Payer: Self-pay | Admitting: Internal Medicine

## 2024-05-26 DIAGNOSIS — Z1231 Encounter for screening mammogram for malignant neoplasm of breast: Secondary | ICD-10-CM

## 2024-05-29 DIAGNOSIS — R062 Wheezing: Secondary | ICD-10-CM | POA: Diagnosis not present

## 2024-05-29 DIAGNOSIS — J189 Pneumonia, unspecified organism: Secondary | ICD-10-CM | POA: Diagnosis not present

## 2024-05-29 DIAGNOSIS — R051 Acute cough: Secondary | ICD-10-CM | POA: Diagnosis not present

## 2024-05-31 ENCOUNTER — Ambulatory Visit: Admitting: Podiatry

## 2024-06-12 ENCOUNTER — Encounter: Payer: Self-pay | Admitting: Podiatry

## 2024-06-12 ENCOUNTER — Other Ambulatory Visit (HOSPITAL_BASED_OUTPATIENT_CLINIC_OR_DEPARTMENT_OTHER): Payer: Self-pay | Admitting: Internal Medicine

## 2024-06-12 ENCOUNTER — Ambulatory Visit (INDEPENDENT_AMBULATORY_CARE_PROVIDER_SITE_OTHER): Admitting: Podiatry

## 2024-06-12 DIAGNOSIS — K219 Gastro-esophageal reflux disease without esophagitis: Secondary | ICD-10-CM | POA: Diagnosis not present

## 2024-06-12 DIAGNOSIS — D3610 Benign neoplasm of peripheral nerves and autonomic nervous system, unspecified: Secondary | ICD-10-CM

## 2024-06-12 DIAGNOSIS — E782 Mixed hyperlipidemia: Secondary | ICD-10-CM

## 2024-06-12 NOTE — Progress Notes (Signed)
 Subjective:   Patient ID: Brianna Snyder, female   DOB: 78 y.o.   MRN: [id number]   HPI Patient presents with exquisite discomfort between the 3rd and 4th toes left and states that she only got temporary relief with certain type of shoe gear after last treatment   ROS      Objective:  Physical Exam  Neurovascular status intact with a positive Mulder sign left third interspace radiating discomfort into the 3rd and 4th toes that are very painful     Assessment:  Chronic neuroma symptomatology left did not respond conservatively     Plan:  H&P reviewed I have recommended neurectomy I explained procedure risk and what would be done and she wants this fixed due to the intensity of discomfort.  I allowed her to read consent form going over surgery alternative treatments complications and the fact that I cannot identify this without looking at it but I feel very optimistic this will be the problem and will solve it.  She understands to be in surgical shoe for 3 to 4 weeks total recovery.  Will probably take around 3 months with all questions answered today and scheduled for surgery end of the month

## 2024-06-20 ENCOUNTER — Ambulatory Visit (HOSPITAL_BASED_OUTPATIENT_CLINIC_OR_DEPARTMENT_OTHER)
Admission: RE | Admit: 2024-06-20 | Discharge: 2024-06-20 | Disposition: A | Discharge status: Home / Self Care | Payer: Self-pay | Source: Ambulatory Visit | Attending: Internal Medicine | Admitting: Internal Medicine

## 2024-06-20 DIAGNOSIS — E782 Mixed hyperlipidemia: Secondary | ICD-10-CM

## 2024-06-21 ENCOUNTER — Ambulatory Visit
Admission: RE | Admit: 2024-06-21 | Discharge: 2024-06-21 | Disposition: A | Discharge status: Home / Self Care | Source: Ambulatory Visit | Attending: Internal Medicine | Admitting: Internal Medicine

## 2024-06-21 DIAGNOSIS — Z1231 Encounter for screening mammogram for malignant neoplasm of breast: Secondary | ICD-10-CM | POA: Diagnosis not present

## 2024-06-22 ENCOUNTER — Telehealth: Payer: Self-pay | Admitting: Podiatry

## 2024-06-22 NOTE — Telephone Encounter (Signed)
 Called and patient is scheduled for surgery on 07/11/2024 per her request. Patient instructed on d/c use of blood thinners prior to surgery.Patient not on any GLP1 medications. Patient pharmacy correct in chart.

## 2024-06-27 ENCOUNTER — Telehealth: Payer: Self-pay

## 2024-06-27 NOTE — Telephone Encounter (Signed)
 Patient called stating that she is filling out paperwork for the surgery center and needs to know the name of the procedure she is having done.

## 2024-06-29 NOTE — Telephone Encounter (Signed)
 Removal soft tissue mass with pathology

## 2024-06-30 ENCOUNTER — Telehealth: Payer: Self-pay

## 2024-06-30 NOTE — Telephone Encounter (Signed)
 error

## 2024-07-10 MED ORDER — HYDROCODONE-ACETAMINOPHEN 10-325 MG PO TABS: 1.0000 | ORAL_TABLET | Freq: Three times a day (TID) | ORAL | 0 refills | Status: AC | PRN

## 2024-07-10 NOTE — Addendum Note (Signed)
 Addended by: MAGDALEN PASCO RAMAN on: 07/10/2024 10:56 PM   Modules accepted: Orders

## 2024-07-11 DIAGNOSIS — G5762 Lesion of plantar nerve, left lower limb: Secondary | ICD-10-CM | POA: Diagnosis not present

## 2024-07-17 ENCOUNTER — Encounter: Payer: Self-pay | Admitting: Lab

## 2024-07-19 ENCOUNTER — Telehealth: Payer: Self-pay | Admitting: Podiatry

## 2024-07-19 ENCOUNTER — Encounter: Payer: Self-pay | Admitting: Podiatry

## 2024-07-19 ENCOUNTER — Ambulatory Visit (INDEPENDENT_AMBULATORY_CARE_PROVIDER_SITE_OTHER)

## 2024-07-19 ENCOUNTER — Ambulatory Visit: Admitting: Podiatry

## 2024-07-19 DIAGNOSIS — D3610 Benign neoplasm of peripheral nerves and autonomic nervous system, unspecified: Secondary | ICD-10-CM

## 2024-07-19 DIAGNOSIS — D3613 Benign neoplasm of peripheral nerves and autonomic nervous system of lower limb, including hip: Secondary | ICD-10-CM

## 2024-07-19 NOTE — Telephone Encounter (Signed)
 Pt called asking when she is able to safely walk for exercise.

## 2024-07-19 NOTE — Progress Notes (Signed)
 Subjective:   Patient ID: Brianna Snyder, female   DOB: 79 y.o.   MRN: [id number]   HPI Patient presents stating doing very well with surgery very pleased   ROS      Objective:  Physical Exam  Neurovascular status intact negative T[place]a' sign noted incision third interspace left healing well wound edges well coapted     Assessment:  Doing well post neuroma excision third interspace left     Plan:  Sterile dressing reapplied continue elevation compression immobilization gradual shoe gear usage over the next few weeks reappoint as symptoms indicate

## 2024-07-26 NOTE — Telephone Encounter (Signed)
Should be able to now.

## 2024-07-31 ENCOUNTER — Encounter
